# Patient Record
Sex: Male | Born: 1935
Health system: Southern US, Community
[De-identification: ages and names within clinical notes are randomized; demographics above are authoritative.]

## PROBLEM LIST (undated history)

## (undated) DIAGNOSIS — I82409 Acute embolism and thrombosis of unspecified deep veins of unspecified lower extremity: Secondary | ICD-10-CM

## (undated) DIAGNOSIS — I493 Ventricular premature depolarization: Secondary | ICD-10-CM

## (undated) DIAGNOSIS — M1990 Unspecified osteoarthritis, unspecified site: Secondary | ICD-10-CM

## (undated) DIAGNOSIS — Z950 Presence of cardiac pacemaker: Secondary | ICD-10-CM

## (undated) DIAGNOSIS — I428 Other cardiomyopathies: Secondary | ICD-10-CM

## (undated) DIAGNOSIS — I509 Heart failure, unspecified: Secondary | ICD-10-CM

## (undated) DIAGNOSIS — N400 Enlarged prostate without lower urinary tract symptoms: Secondary | ICD-10-CM

## (undated) DIAGNOSIS — E785 Hyperlipidemia, unspecified: Secondary | ICD-10-CM

## (undated) DIAGNOSIS — N189 Chronic kidney disease, unspecified: Secondary | ICD-10-CM

## (undated) DIAGNOSIS — I10 Essential (primary) hypertension: Secondary | ICD-10-CM

## (undated) DIAGNOSIS — I2510 Atherosclerotic heart disease of native coronary artery without angina pectoris: Secondary | ICD-10-CM

## (undated) DIAGNOSIS — I499 Cardiac arrhythmia, unspecified: Secondary | ICD-10-CM

## (undated) DIAGNOSIS — K56609 Unspecified intestinal obstruction, unspecified as to partial versus complete obstruction: Secondary | ICD-10-CM

## (undated) DIAGNOSIS — I480 Paroxysmal atrial fibrillation: Secondary | ICD-10-CM

## (undated) HISTORY — DX: Heart failure, unspecified: I50.9

## (undated) HISTORY — DX: Chronic kidney disease, unspecified: N18.9

## (undated) HISTORY — DX: Other cardiomyopathies: I42.8

## (undated) HISTORY — PX: PARTIAL COLECTOMY: SHX5273

## (undated) HISTORY — DX: Acute embolism and thrombosis of unspecified deep veins of unspecified lower extremity: I82.409

## (undated) HISTORY — PX: TONSILLECTOMY: SUR1361

## (undated) HISTORY — DX: Cardiac arrhythmia, unspecified: I49.9

## (undated) HISTORY — DX: Paroxysmal atrial fibrillation: I48.0

## (undated) HISTORY — DX: Unspecified osteoarthritis, unspecified site: M19.90

## (undated) HISTORY — DX: Benign prostatic hyperplasia without lower urinary tract symptoms: N40.0

## (undated) HISTORY — DX: Ventricular premature depolarization: I49.3

## (undated) HISTORY — PX: VASECTOMY: SHX75

## (undated) HISTORY — PX: OTHER SURGICAL HISTORY: SHX169

## (undated) HISTORY — PX: BASAL CELL CARCINOMA EXCISION: SHX1214

## (undated) HISTORY — PX: APPENDECTOMY: SHX54

## (undated) HISTORY — DX: Hyperlipidemia, unspecified: E78.5

---

## 1998-01-26 ENCOUNTER — Ambulatory Visit (HOSPITAL_BASED_OUTPATIENT_CLINIC_OR_DEPARTMENT_OTHER): Admission: RE | Admit: 1998-01-26 | Discharge: 1998-01-26 | Payer: Self-pay | Admitting: Plastic Surgery

## 1998-02-06 ENCOUNTER — Ambulatory Visit (HOSPITAL_COMMUNITY): Admission: RE | Admit: 1998-02-06 | Discharge: 1998-02-06 | Payer: Self-pay | Admitting: Internal Medicine

## 2000-10-17 ENCOUNTER — Ambulatory Visit (HOSPITAL_COMMUNITY): Admission: RE | Admit: 2000-10-17 | Discharge: 2000-10-17 | Payer: Self-pay | Admitting: *Deleted

## 2003-04-18 ENCOUNTER — Encounter: Payer: Self-pay | Admitting: Emergency Medicine

## 2003-04-18 ENCOUNTER — Inpatient Hospital Stay (HOSPITAL_COMMUNITY): Admission: EM | Admit: 2003-04-18 | Discharge: 2003-04-21 | Payer: Self-pay | Admitting: Emergency Medicine

## 2003-04-21 HISTORY — PX: CARDIAC CATHETERIZATION: SHX172

## 2003-05-01 ENCOUNTER — Ambulatory Visit (HOSPITAL_COMMUNITY): Admission: RE | Admit: 2003-05-01 | Discharge: 2003-05-01 | Payer: Self-pay | Admitting: Internal Medicine

## 2004-12-14 ENCOUNTER — Ambulatory Visit (HOSPITAL_COMMUNITY): Admission: RE | Admit: 2004-12-14 | Discharge: 2004-12-14 | Payer: Self-pay | Admitting: *Deleted

## 2004-12-14 ENCOUNTER — Encounter (INDEPENDENT_AMBULATORY_CARE_PROVIDER_SITE_OTHER): Payer: Self-pay | Admitting: *Deleted

## 2005-10-14 ENCOUNTER — Encounter: Admission: RE | Admit: 2005-10-14 | Discharge: 2005-10-14 | Payer: Self-pay | Admitting: *Deleted

## 2005-10-20 ENCOUNTER — Inpatient Hospital Stay (HOSPITAL_COMMUNITY): Admission: AD | Admit: 2005-10-20 | Discharge: 2005-10-21 | Payer: Self-pay | Admitting: *Deleted

## 2007-07-12 HISTORY — PX: ABDOMINAL SURGERY: SHX537

## 2008-02-19 ENCOUNTER — Emergency Department (HOSPITAL_COMMUNITY): Admission: EM | Admit: 2008-02-19 | Discharge: 2008-02-20 | Payer: Self-pay | Admitting: Emergency Medicine

## 2008-03-08 ENCOUNTER — Inpatient Hospital Stay (HOSPITAL_COMMUNITY): Admission: EM | Admit: 2008-03-08 | Discharge: 2008-03-16 | Payer: Self-pay | Admitting: *Deleted

## 2008-03-11 ENCOUNTER — Encounter (INDEPENDENT_AMBULATORY_CARE_PROVIDER_SITE_OTHER): Payer: Self-pay | Admitting: General Surgery

## 2010-02-12 HISTORY — PX: NM MYOCAR PERF WALL MOTION: HXRAD629

## 2010-02-23 ENCOUNTER — Ambulatory Visit (HOSPITAL_COMMUNITY): Admission: RE | Admit: 2010-02-23 | Discharge: 2010-02-23 | Payer: Self-pay | Admitting: Cardiology

## 2010-02-23 HISTORY — PX: A-V CARDIAC PACEMAKER INSERTION: SHX562

## 2010-09-23 LAB — CBC
MCH: 31 pg (ref 26.0–34.0)
MCHC: 34.3 g/dL (ref 30.0–36.0)
MCV: 90.4 fL (ref 78.0–100.0)
Platelets: 154 10*3/uL (ref 150–400)

## 2010-09-23 LAB — BASIC METABOLIC PANEL
BUN: 19 mg/dL (ref 6–23)
CO2: 24 mEq/L (ref 19–32)
Calcium: 8.7 mg/dL (ref 8.4–10.5)
Chloride: 110 mEq/L (ref 96–112)
Creatinine, Ser: 1.17 mg/dL (ref 0.4–1.5)
Glucose, Bld: 94 mg/dL (ref 70–99)

## 2010-09-23 LAB — SURGICAL PCR SCREEN: Staphylococcus aureus: NEGATIVE

## 2010-09-23 LAB — PROTIME-INR: Prothrombin Time: 18.3 seconds — ABNORMAL HIGH (ref 11.6–15.2)

## 2010-11-23 NOTE — Op Note (Signed)
Samuel Mann, Samuel Mann NO.:  [id number]   MEDICAL RECORD NO.:  [id number]          PATIENT TYPE:  INP   LOCATION:  5151                         FACILITY:  [facility]   PHYSICIAN:  Adolph Pollack, M.D.DATE OF BIRTH:  1922/02/07   DATE OF PROCEDURE:  03/11/2008  DATE OF DISCHARGE:                               OPERATIVE REPORT   PREOPERATIVE DIAGNOSIS:  Cystic right lower quadrant neoplasm.   POSTOPERATIVE DIAGNOSIS:  Cystic right lower quadrant neoplasm.   PROCEDURE:  Laparoscopic converted to open right hemicolectomy with  resection of distal ileum.   SURGEON:  Adolph Pollack, MD   FIRST ASSISTANT:  Kelle Darting. Rennis Harding, NP   SECOND ASSISTANT:  Tomma Rakers, PA student.   ANESTHESIA:  General.   INDICATIONS:  This 77 year old male came in with the abrupt onset of  abdominal pain, nausea, and vomiting.  He had right lower quadrant  tenderness and a CT scan demonstrated a cystic neoplasm of the right  lower quadrant.  There was concern this was a mucocele of the appendix  which could be either benign or malignant.  He has been cleared by  Cardiology, undergone a gentle bowel prep, and is now brought to the  operating room for laparoscopic possible open bowel resection/cystic  removal.   TECHNIQUE:  He was brought to the operating room, placed supine on the  operating table, and general anesthetic was administered.  He was then  placed in the lithotomy position.  The hair on the abdominal wall was  clipped and the abdominal wall was sterilely prepped and draped.  It in  the left upper quadrant, a small incision was made through the skin and  subcutaneous tissue.  Using 5 mm Optiview trocar, the abdominal cavity  was entered and pneumoperitoneum created by insufflation of CO2 gas.  I  looked directly beneath the trocar and noted no injury to viscera or  solid organs.   Following this, a 5-mm trocar was placed in the subxiphoid region and  one in the  subumbilical area.  We visualized the right lower quadrant  and noticed a cystic lesion in the right lower quadrant adherent to the  right lower quadrant peritoneum.  Using sharp dissection, I gently  dissected the mass off the peritoneum quite posterior.  It appeared be  fairly adherent to the terminal ileum and this portion of the ileum  appeared to be somewhat thickened with some creeping fat.  Following  this, I then partially mobilized the right colon by dividing its lateral  attachments in anticipation of having a hemicolectomy.  I then removed  the subumbilical 5-mm trocar, made a slightly larger incision, and  inserted a hand-assist GelPort device.  I then used my hand to complete  further dissection, but noticed that the cystic lesion was fairly close  to the ureter and the gonadal vessels.  Thus, I felt I needed to do this  under more direct vision.  I then extended the periumbilical incision  superiorly and inferiorly.  A self-retaining retractor was used.  Under  direct vision, I carefully dissected the  mass away from the right ureter  and gonadal vessels.  It appeared to be attached to the terminal ileum.  I mobilized the hepatic flexure of the colon.  Because I did not know  the nature of the mass, I decided to perform a right hemicolectomy.   I dissected the omentum free from the proximal transverse colon.  I made  the small incisions in the mesentery close to the bowel and the terminal  ileum and the proximal transverse colon.  I divided the mesentery with  the LigaSure and with suture ligatures.  I further mobilized the lateral  aspect of the right colon allowing the cystic mass as well as the distal  ileum and the right colon to come a bit to the wound.  I then picked  points of resection on the distal ileum and the transverse colon and  made enterotomies in both.  I then performed a side-to-side anastomosis  with a GIA stapler.  The remaining common defect was then  closed with a  linear noncutting stapler which completed the resection, and the  specimen including the distal ileum and right colon were then handed off  the field and sent to pathology.   The anastomosis was inspected and was patent, viable, and under no  tension.  A crotch stitch of 3-0 silk was used.  The mesenteric defect  was closed with interrupted 3-0 silk sutures as well.  Some bleeding  from the common defect closure staple line was controlled with 3-0 silk  sutures.   Following this, gloves were changed.  I then copiously irrigated out the  abdominal cavity with saline solution.  There appeared to be no bleeding  at this time.  No lesions in the liver.  Gallbladder was without  gallstones.  Stomach appeared to be within normal limits.  No pelvic or  distal colon lesions were noted.   I then evacuated the irrigation fluid.  I began reapproximating the  fascia with a running #1 PDS suture.  Needle, sponge, and instrument  counts were reported to be correct.  The trocars were removed and the  subcutaneous tissue of the midline incision was irrigated and the skin  of the midline incision and the trocar sites were closed with staples.  Sterile dressings were applied.   He tolerated the procedure well without any apparent complications and  was taken to the recovery in satisfactory condition.      Adolph Pollack, M.D.  Electronically Signed     TJR/MEDQ  D:  03/11/2008  T:  03/12/2008  Job:  [id number]

## 2010-11-23 NOTE — H&P (Signed)
Samuel Mann, GUBBELS NO.:  [id number]   MEDICAL RECORD NO.:  [id number]          PATIENT TYPE:  INP   LOCATION:  5151                         FACILITY:  [facility]   PHYSICIAN:  Juanetta Gosling, MDDATE OF BIRTH:  Nov 27, 1921   DATE OF ADMISSION:  03/08/2008  DATE OF DISCHARGE:                              HISTORY & PHYSICAL   CONSULTANT:  Samuel Jester, DO   CHIEF COMPLAINT:  Abdominal pain.   HISTORY OF PRESENT ILLNESS:  An 77 year old male who ate lunch yesterday  at 1:30 and had developed generalized abdominal pain associated with  some nausea and no vomiting.  He had a normal bowel movement last night.  He was awakened from sleep last night at about 2 a.m., called the EMS,  and they brought him to the emergency room.  Since then, he describes  resolution of most of his pain and he is hungry now.  He has a history  of a normal colonoscopy 3-4 years ago.   PAST MEDICAL HISTORY:  Hypertension and atrial fibrillation.   PAST SURGICAL HISTORY:  Pacemaker for bradycardia.   SOCIAL HISTORY:  Nonsmoker.  No alcohol.   ALLERGIES:  No known drug allergies.   MEDICATIONS:  1. Coumadin.  2. Diovan 80 mg daily.  3. Crestor.  4. Lopressor 50 b.i.d.   REVIEW OF SYSTEMS:  He has some occasional shortness of breath with  exertion.   PHYSICAL EXAMINATION:  VITAL SIGNS:  Afebrile with stable vital signs.  HEENT:  Normocephalic and atraumatic.  NECK:  Supple without adenopathy.  CHEST:  Clear bilaterally.  He has a pacemaker in place.  HEART:  Irregularly irregular.  ABDOMEN:  Soft.  Mild tenderness to right lower quadrant with no  peritonitis.  GU:  No hernia.  EXTREMITIES:  Nontender.  NEUROLOGIC:  He is grossly normal.   His CT scan of his abdomen shows a couple of cystic lesions in his  kidneys as well as some free fluid in his right lower quadrant and his  pelvis.  He has tubular, well-defined fluid collection in his paracecal  region that may  represent an appendiceal mucocele, he has no other  findings of appendicitis on his CT scan.   LABORATORY DATA:  White blood cell count of 12.2, hematocrit of 38.4,  platelets of 195.  His INR is 2.6.  Lipase is 20.  LFTs are normal.  His  BUN and creatinine are 28.2 and 1.6.  His UA shows no evidence of  infection.   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   PLAN:  I do not think he has appendicitis either by exam or his CT scan.  I will plan on admitting him as he does have a mass in his right lower  quadrant.  We will give him vitamin K for his INR.  Let his INR drift  back to normal and we will likely need an exploration with what appeared  to be a right hemicolectomy, however, versus possible  appendectomy/ileocectomy once his INR is normal.  will consider  colonoscopy prior to surgery as well.  Juanetta Gosling, MD  Electronically Signed     MCW/MEDQ  D:  03/08/2008  T:  03/09/2008  Job:  [id number]

## 2010-11-26 NOTE — Cardiovascular Report (Signed)
Samuel Mann, Samuel Mann NO.:  [id number]   MEDICAL RECORD NO.:  [id number]                   PATIENT TYPE:  INP   LOCATION:  2019                                 FACILITY:  [facility]   PHYSICIAN:  Cristy Hilts. Jacinto Halim, M.D.                  DATE OF BIRTH:  1921/12/16   DATE OF PROCEDURE:  04/21/2003  DATE OF DISCHARGE:                              CARDIAC CATHETERIZATION   ATTENDING CARDIOLOGIST:  Nanetta Batty, M.D.   REFERRING PHYSICIAN:  Soyla Murphy. Renne Crigler, M.D.   PROCEDURE PERFORMED:  1. Left ventriculography.  2. Selective right and left coronary arteriography.  3. Abdominal aortogram.  4. Right femoral angiography and closure of the right femoral artery access     with Perclose.   INDICATIONS:  Mr. Samuel Mann is a 77 year old active gentleman who has been  having increasing shortness of breath and chest discomfort over the last two  to three months.  Was initially seen in the outpatient setting and was set  up for an outpatient Cardiolite stress test.  However, he presented to the  emergency room with chest pain which was not relieved with two sublingual  nitroglycerin via EMS.  Myocardial infarction was ruled out and because of  his chest pain he was brought directly to the catheterization laboratory to  evaluate his coronary anatomy.   HEMODYNAMIC DATA:  Left ventricular pressures were 102/5, with end-diastolic  pressure of 11 mmHg.  The aortic pressures were 94/48 with a mean of 67  mmHg.  There was no significant pressure gradient across the aortic valve.   ANGIOGRAPHIC DATA:  Left ventricle:  Left ventricular systolic function was  on the lower limit of normal and was estimated at 55%.  There is mild  anterolateral wall hypokinesis.   CORONARY ANGIOGRAPHY:  1. Right coronary artery:  RCA is nondominant.  2. Left main coronary artery:  LMCA is normal.  3. Circumflex coronary artery:  Circumflex coronary artery is a large     caliber vessel and  a dominant vessel.  It is smooth and gives origin to a     large PDA.  It gives origin to three small obtuse marginals and a     moderate sized obtuse marginal #4.  Continues as PDA.  4. Left anterior descending artery:  Left anterior descending is, again, a     large caliber vessel.  It wraps around the apex.  Gives origin to two     small diagonals and a moderate sized diagonal 3.  Proximal LAD has mild     luminal irregularity.  Mid to distal LAD has a small intramyocardial     bridging.   ABDOMINAL AORTOGRAM:  Abdomen aortogram revealed presence of two renal  arteries, one on either side.  They were normal.  The aortoiliac bifurcation  was normal.   IMPRESSION:  1. Noncritical coronary artery disease.  2. Mild anterolateral wall hypokinesis the etiology for which remains     uncertain and I could not see any hazy or thrombus containing lesion in     the left anterior descending.  However, there was slow filling noted in     all the coronary arteries.  This could be secondary to microvascular     disease.   RECOMMENDATIONS:  Proton pump inhibitors.  Will stop his beta blockers given  his significant bradycardia.  Will continue lipid lowering therapy and  aspirin.  Continue ACE inhibitors for hypertension.  Plus/minus  nitroglycerin for microvascular disease.  The patient will follow up with  Nanetta Batty, M.D.   TECHNIQUE OF PROCEDURE:  Under the usual sterile precautions using a 6-  French right femoral arterial access sheath, 6-French multipurpose B2  catheter was advanced to the ascending aorta with 0.035 [place] J-wire.  The  catheter was gently advanced to the left ventricle.  Left ventricular  pressures were monitored.  Hand contrast into the left ventricle was  performed both in LAO and RAO projection.  The catheter was flushed with  saline and pulled back into the ascending aorta and pressure gradient across  the aorta was monitored.  Right coronary artery was  selectively engaged.  Angiography was performed.  Then the catheter was pulled back into the  abdominal aorta and abdominal aortogram was performed.  Then the catheter  was pulled out and initially a 6-French Judkins left 5 and then a Judkins  left 4 diagnostic catheter was utilized to engage the left main coronary  artery and angiography was performed.  Then the catheter was pulled out of  the body in the usual fashion.  Right femoral angiography was performed  through the arterial access sheath and the access was closed with Perclose  with excellent hemostasis obtained.  The patient was transferred to the  recovery in a stable condition.                                               Cristy Hilts. Jacinto Halim, M.D.    Pilar Plate  D:  04/21/2003  T:  04/21/2003  Job:  [id number]   cc:   Nanetta Batty, M.D.  [address]  [place]  [place] [address]  Fax: [phone]   Soyla Murphy. Renne Crigler, M.D.  [address]  [place]  [place] [address]  Fax: [phone]

## 2010-11-26 NOTE — Discharge Summary (Signed)
NAMECARLA, WHILDEN                 ACCOUNT NO.:  [id number]   MEDICAL RECORD NO.:  [id number]          PATIENT TYPE:  INP   LOCATION:  5151                         FACILITY:  [facility]   PHYSICIAN:  Clovis Pu. Cornett, M.D.DATE OF BIRTH:  02/14/1922   DATE OF ADMISSION:  03/08/2008  DATE OF DISCHARGE:  03/16/2008                               DISCHARGE SUMMARY   ADMITTING DIAGNOSES:  1. Abdominal pain.  2. Cystic mass of right colon.  3. Atrial fibrillation.  4. History of pacemaker.   DISCHARGE DIAGNOSES:  1. Abdominal pain.  2. Cystic mass of right colon.  3. Atrial fibrillation.  4. History of pacemaker.   PROCEDURE PERFORMED:  Right hemicolectomy.   BRIEF HISTORY:  The patient is an 77 year old male admitted on March 08, 2008, due to abdominal pain.  CT scan showed a cystic mass of his  right colon and he was admitted for further evaluation.  He was on  anticoagulation due to chronic atrial fibrillation.  He was seen by the  Surgical Service, evaluated and surgery was recommended for resection of  his right colon due to the cystic mass.   HOSPITAL COURSE:  The patient underwent right hemicolectomy by Dr. Avel Peace on March 11, 2008.  Please see operative note for details.  The patient's operative course was relatively unremarkable.  He had a  somewhat of an ileus and this resolved over the next 2-3 days.  His  hemoglobin remained relatively stable.  His bowel function returned.  His Coumadin was restarted prior to discharge.  His wounds were clean,  dry, and intact.  He was afebrile and he was discharged home on  March 16, 2008, in satisfactory condition.  Final pathology showed  benign mesothelial cyst involving terminal ileum with associated  fibrosis and adhesions.   DISCHARGE INSTRUCTIONS:  He will go home and a soft diet to be advanced  and regular diet as tolerated.  He will refrain from lifting and driving  for 2-4 weeks until he sees Dr. Abbey Chatters  back in the office.  He will  be placed back on his preoperative medications, which include a Diovan  80 mg daily, Lopressor 50 mg p.o. b.i.d. and Coumadin at discharge, he  was on 5 mg daily.  He will have his PT/INR checked in 2 days.   CONDITION ON DISCHARGE:  Improved.     Thomas A. Cornett, M.D.  Electronically Signed    TAC/MEDQ  D:  05/01/2008  T:  05/01/2008  Job:  [id number]

## 2010-11-26 NOTE — Op Note (Signed)
NAMEJAHRON, HUNSINGER NO.:  [id number]   MEDICAL RECORD NO.:  [id number]          PATIENT TYPE:  OIB   LOCATION:  2807                         FACILITY:  [facility]   PHYSICIAN:  Darlin Priestly, MD  DATE OF BIRTH:  1921/10/28   DATE OF PROCEDURE:  10/20/2005  DATE OF DISCHARGE:                                 OPERATIVE REPORT   PROCEDURE PERFORMED:  Insertion of a Medtronic EnRhythm P1501 DR generator,  serial number [id number] with passive atrial and ventricular leads.   ATTENDING PHYSICIAN:  Darlin Priestly, MD   COMPLICATIONS:  None.   INDICATIONS FOR PROCEDURE:  Mr. Langdon is an 77 year old male patient of Dr.  Nanetta Batty, Dr. Merri Brunette with a history of noncritical coronary  artery disease by cath in 2004 with normal left ventricular function.  He  has had chronic bradycardia with six to 12 months of increasing lethargy and  recent Holter monitor revealing evidence of tachybrady syndrome with rates  in the 40s.  He has also had some mild dizziness upon standing.  He is now  referred for a dual chamber pacer implant.   DESCRIPTION OF PROCEDURE:  After obtaining informed consent, the patient was  brought to the cardiac catheterization lab where the left chest was shaved,  prepped and draped in the usual sterile fashion.  Anesthesia monitoring  established.  1% lidocaine was then used to anesthetize the left subclavian  region.  Next, an approximately 3 cm mid infraclavicular incision was then  carried out beneath the left clavicle and hemostasis was obtained with  electrocautery.  Blunt dissection was used to carry this down to the left  pectoral fascia.  Next, approximately a 3 x 4 cm pocket was then created  over the left pectoral fascia and again hemostasis was obtained with  electrocautery.  The left subclavian vein was then easily visualized with IV  contrast and two left subclavian sticks were then used to introduce two  retained guide wires  which were easily positioned in the SVC and right  atrium.  A #7 French safe sheath was then placed over the first guidewire  and the guidewire and dilator were removed.  Following this, a 58 cm passive  Medtronic lead, model number [id number], serial number [id number] was then easily  passed into the right atrium and peel-away sheath was then removed.  A  second 7 French safe sheath was then inserted over the second retained  guidewire.  The guidewire and dilator were then removed.  Next, a 53 cm  passive Medtronic lead, model number 5594, serial number [id number] was then  passed into the right atrium and peel-away sheath removed.  A J-curve was  then placed on the ventricular lead stylet.  The ventricular lead was then  allowed to prolapse to the tricuspid valve in position of the RV apex  without difficulty.  Thresholds were then determined.  R-waves were measured  at 16.5 mV.  Thresholds were 0.4 V at 0.5 msec.  Impedance is 709 ohms.  Current is 0.7 mA.  10 V negative for diaphragmatic stimulation.  This lead  was then secured to the pectoral fascia with two silk sutures.  The atrial  lead was then positioned in the right atrial appendage and thresholds were  determined.  P-waves were measured at 5.5 mV.  Threshold in the atrium was  0.8 volts at 0.5 msec.  Impedance 448 ohms.  Current 2.1 mA.  Again 10 V  negative for diaphragmatic stimulation.  Two silk sutures were then used to  secure this lead to the pectoral fascia.  The pocket was then copiously  irrigated with 1% Kanamycin solution and again hemostasis was confirmed.  The leads were then connected in serial fashion to the Medtronic EnRhythm  generator, model P1501Dr, serial number [id number].  Head screws were  tightened and the pacing was confirmed.  A single silk suture was then  placed in the apex of the pocket.  The generator and leads were then  delivered to the pocket and the header was secured to the silk suture.  The   subcutaneous layer was then closed with running 2-0 Vicryl.  The skin was  then closed with running 4-0 Vicryl.  Steri-Strips were then applied.  The  patient was then transferred to the recovery room in stable condition.   CONCLUSION:  Successful implant of a Medtronic EnRhythm [id number] generator,  serial number [id number] with passive atrial and ventricular leads.      Darlin Priestly, MD  Electronically Signed     RHM/MEDQ  D:  10/20/2005  T:  10/20/2005  Job:  [id number]   cc:   Nanetta Batty, M.D.  Fax: [phone]   Soyla Murphy. Renne Crigler, M.D.  Fax: [phone]

## 2010-11-26 NOTE — Op Note (Signed)
NAMEMAYFIELD, SCHOENE NO.:  [id number]   MEDICAL RECORD NO.:  [id number]          PATIENT TYPE:  AMB   LOCATION:  ENDO                         FACILITY:  [facility]   PHYSICIAN:  Georgiana Spinner, M.D.    DATE OF BIRTH:  [birth year]   DATE OF PROCEDURE:  12/14/2004  DATE OF DISCHARGE:                                 OPERATIVE REPORT   PROCEDURE:  Upper endoscopy.   INDICATIONS:  Abdominal pain.   ANESTHESIA:  Demerol 30, Versed 3 milligrams.   PROCEDURE:  With the patient mildly sedated in the left lateral decubitus  position the Olympus videoscopic endoscope was inserted in the mouth and  passed under direct vision through the esophagus which appeared normal into  the stomach. The fundus body, antrum, duodenal bulb, second portion duodenum  were visualized.  From this point, the endoscope was slowly withdrawn taking  circumferential views of the duodenal mucosa stopping in the bulb where some  inflammatory changes were noted. They was fairly mild but they were  photographed and biopsied. We pulled the endoscope back into the stomach,  placed it in retroflexion to view the stomach from below.  The endoscope was  then straightened and withdrawn taking circumferential views remaining  gastric and esophageal mucosa stopping to biopsy fundic polyp.  The  endoscope was then withdrawn. The patient's vital signs, pulse oximeter  remained stable. The patient tolerated procedure well without apparent  complications.   FINDINGS:  Mild inflammatory changes in the duodenal bulb, gastric fundus  polyp. Await biopsy reports. The patient will call me for results. Follow-up  with me as an outpatient.      GMO/MEDQ  D:  12/14/2004  T:  12/14/2004  Job:  [id number]

## 2010-11-26 NOTE — Procedures (Signed)
[place]  Patient:    Samuel Mann, Samuel Mann                          MRN: [id number] Adm. Date:  [id number] Attending:  Sabino Gasser                           Procedure Report  PROCEDURE:  Colonoscopy.  INDICATIONS:  Rectal bleeding.  ANESTHESIA:  Demerol 40 mg, Versed 4 mg.  DESCRIPTION OF PROCEDURE:  With patient mildly sedated in the left lateral decubitus position, a rectal exam was performed, which was unremarkable. Subsequently the Olympus videoscopic colonoscope was inserted in the rectum and passed under direct vision to the cecum, identified by the ileocecal valve and appendiceal orifice, both of which were photographed.  From this point, the colonoscope was slowly withdrawn, taking circumferential views of the entire colonic mucosa, stopping in the rectum, which appeared normal on direct and retroflex view.  The endoscope was straightened and withdrawn.  The patients vital signs and pulse oximetry remained stable.  The patient tolerated the procedure well with no apparent complications.  FINDINGS:  Unremarkable colonoscopic examination to the cecum.  PLAN:  Repeat examination in five to 10 years. DD:  10/17/00 TD:  10/17/00 Job: [id number]

## 2010-11-26 NOTE — Discharge Summary (Signed)
Samuel Mann, DEAVERS NO.:  [id number]   MEDICAL RECORD NO.:  [id number]          PATIENT TYPE:  INP   LOCATION:  4707                         FACILITY:  [facility]   PHYSICIAN:  Darlin Priestly, MD  DATE OF BIRTH:  April 26, 1922   DATE OF ADMISSION:  10/20/2005  DATE OF DISCHARGE:  10/21/2005                                 DISCHARGE SUMMARY   DISCHARGE DIAGNOSES:  1.  Medtronic pacemaker implant October 20, 2005 for sick sinus syndrome with      sinus tachycardia and marked sinus bradycardia on outpatient Holter.  2.  Noncritical coronary disease by catheterization 2004 with normal LV      function.  3.  Dyslipidemia.  4.  Treated hypertension.   HOSPITAL COURSE:  The patient is an 77 year old male followed by Dr. Allyson Sabal  and Dr. Merri Brunette.  He has chronic bradycardia and has at times had  tachycardia.  He is followed by Dr. Renne Crigler and Dr. Allyson Sabal.  He had noncritical  coronary disease by catheterization in 2004.  A 24-hour Holter as an  outpatient ordered through [facility] revealed evidence of sinus  rhythm, sinus arrhythmia, intermittent episodes of sinus tachycardia and  occasional marked sinus bradycardia.  He had heart rates at home that he has  recorded of less than 40 when he was active.  He has had no syncope or  presyncope, although he does have some dizziness at times, going from  sitting to standing.  He was seen by Dr. Jenne Campus September 29, 2005 and was  admitted for elective pacemaker implant on October 20, 2005.  The patient had  a Medtronic dual chamber pacemaker implanted without complications.  He was  discharged October 21, 2005.   DISCHARGE MEDICATIONS:  1.  Lipid 10 mg a day.  2.  Micardis 20 mg a day.   LABORATORY DATA:  EKG shows a sinus rhythm, PACs. Chest x-ray post procedure  shows no pneumothorax.   DISPOSITION:  The patient discharged in stable condition and will follow-up  with Dr. Jenne Campus as an outpatient.      Abelino Derrick,  P.A.      Darlin Priestly, MD  Electronically Signed   LKK/MEDQ  D:  11/25/2005  T:  11/25/2005  Job:  [id number]   cc:   Soyla Murphy. Renne Crigler, M.D.  Fax: [phone]

## 2010-11-26 NOTE — Discharge Summary (Signed)
Samuel Mann, Samuel Mann NO.:  [id number]   MEDICAL RECORD NO.:  [id number]                   PATIENT TYPE:  INP   LOCATION:  2019                                 FACILITY:  [facility]   PHYSICIAN:  Cristy Hilts. Jacinto Halim, M.D.                  DATE OF BIRTH:  11-Jan-1922   DATE OF ADMISSION:  04/18/2003  DATE OF DISCHARGE:  04/21/2003                                 DISCHARGE SUMMARY   DISCHARGE DIAGNOSES:  1. Unstable angina.  2. Noncritical coronary disease at catheterization.  3. Treated hypertension.  4. Premature ventricular contractions.   HOSPITAL COURSE:  The patient is a 77 year old male followed by Dr. Allyson Sabal  and Dr. Jamie Kato.  He had seen Dr. Allyson Sabal recently and was to have a stress test.  He has had a previous negative Cardiolite in 2000.  He presented to the  emergency room April 18, 2003 after having some chest discomfort while  working in his wood shop.  Symptoms were worrisome for unstable angina.  He  was admitted to telemetry, started on heparin and nitroglycerin.  He was set  up for diagnostic catheterization.  He ruled out for an myocardial  infarction.  Catheterization done April 21, 2003 revealed normal LV  function, normal nondominant RCA, normal left main, 20% proximal narrowing  in the LAD, normal circumflex.  He had normal renal arteries and normal  iliacs.  Dr. Jacinto Halim feels he has noncritical coronary disease.  We did make  some adjustments in his medications.  He had been on a low dose of beta  blocker while in the hospital and had a run of isoarrhythmic AV dissociation  and accelerated junctional rhythm and PVCs, beta blocker was discontinued.  We will continue him on a statin and baby aspirin.  We changed his  antihypertensive to Altace.  He will follow up with Dr. Jamie Kato.  He has been  instructed not to do any strenuous activity for the next 48 hours.   DISCHARGE MEDICATIONS:  1. Coated aspirin 81 mg daily.  2. Altace 2.5 mg daily.  3.  Lipitor 10 mg h.s.   LABORATORY DATA:  White count 9.0, hemoglobin 11.9, hematocrit 34.7,  platelets 204, INR 1.1, sodium 142, potassium 4.0, BUN 22, creatinine 1.3.  Liver functions are normal.  CK-MB and troponins were negative.  Lipid  profile shows a cholesterol of 180, triglycerides 69, HDL 57, LDL 109, TSH  3.02.  Urinalysis is unremarkable.  Chest x-ray shows borderline mild  cardiomegaly and some atelectasis.  EKG shows sinus rhythm with PVCs.   DISPOSITION:  The patient discharged in stable condition and will follow up  with Dr. Jamie Kato.       Abelino Derrick, P.A.                      Cristy Hilts. Jacinto Halim, M.D.  LKK/MEDQ  D:  04/21/2003  T:  04/22/2003  Job:  [id number]

## 2011-03-01 HISTORY — PX: US ECHOCARDIOGRAPHY: HXRAD669

## 2011-04-08 LAB — DIFFERENTIAL
Basophils Absolute: 0
Basophils Relative: 0
Eosinophils Relative: 2
Lymphocytes Relative: 23
Monocytes Absolute: 0.8
Monocytes Relative: 11
Neutro Abs: 4.6

## 2011-04-08 LAB — POCT I-STAT, CHEM 8
BUN: 23
Calcium, Ion: 1.2
Chloride: 109
Creatinine, Ser: 1.1
Glucose, Bld: 97
HCT: 38 — ABNORMAL LOW
Hemoglobin: 12.9 — ABNORMAL LOW
Potassium: 4.2
Sodium: 143
TCO2: 24

## 2011-04-08 LAB — CBC
HCT: 36.7 — ABNORMAL LOW
Hemoglobin: 12.6 — ABNORMAL LOW
MCHC: 34.5
RBC: 4.06 — ABNORMAL LOW
RDW: 13.2

## 2011-04-08 LAB — POCT CARDIAC MARKERS
CKMB, poc: 1.3
Myoglobin, poc: 73.1
Troponin i, poc: 0.05

## 2011-04-13 LAB — BASIC METABOLIC PANEL
CO2: 26
CO2: 26
Calcium: 8.1 — ABNORMAL LOW
Chloride: 105
Creatinine, Ser: 1.35
GFR calc Af Amer: >60
GFR calc Af Amer: >60
Potassium: 4.2
Potassium: 4.8
Sodium: 139
Sodium: 141

## 2011-04-13 LAB — CBC
HCT: 31.6 — ABNORMAL LOW
HCT: 33.1 — ABNORMAL LOW
Hemoglobin: 10.5 — ABNORMAL LOW
Hemoglobin: 11.3 — ABNORMAL LOW
MCHC: 33.4
MCV: 91.4
RBC: 3.37 — ABNORMAL LOW
RBC: 3.62 — ABNORMAL LOW
WBC: 10.5

## 2011-04-13 LAB — PREPARE FRESH FROZEN PLASMA

## 2011-04-13 LAB — TYPE AND SCREEN
ABO/RH(D): O NEG
Antibody Screen: NEGATIVE

## 2011-04-13 LAB — APTT
aPTT: 38 — ABNORMAL HIGH
aPTT: 40 — ABNORMAL HIGH

## 2011-04-13 LAB — PROTIME-INR
INR: 1.2
INR: 1.3
INR: 1.3
Prothrombin Time: 16.6 — ABNORMAL HIGH

## 2011-06-17 ENCOUNTER — Other Ambulatory Visit: Payer: Self-pay | Admitting: Dermatology

## 2011-07-18 DIAGNOSIS — H40019 Open angle with borderline findings, low risk, unspecified eye: Secondary | ICD-10-CM | POA: Diagnosis not present

## 2011-07-28 DIAGNOSIS — I4891 Unspecified atrial fibrillation: Secondary | ICD-10-CM | POA: Diagnosis not present

## 2011-07-28 DIAGNOSIS — Z45018 Encounter for adjustment and management of other part of cardiac pacemaker: Secondary | ICD-10-CM | POA: Diagnosis not present

## 2011-07-28 DIAGNOSIS — I509 Heart failure, unspecified: Secondary | ICD-10-CM | POA: Diagnosis not present

## 2011-08-04 DIAGNOSIS — Z7901 Long term (current) use of anticoagulants: Secondary | ICD-10-CM | POA: Diagnosis not present

## 2011-08-04 DIAGNOSIS — I4891 Unspecified atrial fibrillation: Secondary | ICD-10-CM | POA: Diagnosis not present

## 2011-09-05 DIAGNOSIS — I4891 Unspecified atrial fibrillation: Secondary | ICD-10-CM | POA: Diagnosis not present

## 2011-09-05 DIAGNOSIS — Z7901 Long term (current) use of anticoagulants: Secondary | ICD-10-CM | POA: Diagnosis not present

## 2011-10-06 DIAGNOSIS — Z7901 Long term (current) use of anticoagulants: Secondary | ICD-10-CM | POA: Diagnosis not present

## 2011-10-06 DIAGNOSIS — I4891 Unspecified atrial fibrillation: Secondary | ICD-10-CM | POA: Diagnosis not present

## 2011-10-22 DIAGNOSIS — I4891 Unspecified atrial fibrillation: Secondary | ICD-10-CM | POA: Diagnosis not present

## 2011-10-22 DIAGNOSIS — I509 Heart failure, unspecified: Secondary | ICD-10-CM | POA: Diagnosis not present

## 2011-10-26 DIAGNOSIS — I10 Essential (primary) hypertension: Secondary | ICD-10-CM | POA: Diagnosis not present

## 2011-10-26 DIAGNOSIS — E7800 Pure hypercholesterolemia, unspecified: Secondary | ICD-10-CM | POA: Diagnosis not present

## 2011-10-26 DIAGNOSIS — Z79899 Other long term (current) drug therapy: Secondary | ICD-10-CM | POA: Diagnosis not present

## 2011-10-31 DIAGNOSIS — E7800 Pure hypercholesterolemia, unspecified: Secondary | ICD-10-CM | POA: Diagnosis not present

## 2011-10-31 DIAGNOSIS — D649 Anemia, unspecified: Secondary | ICD-10-CM | POA: Diagnosis not present

## 2011-10-31 DIAGNOSIS — Z0000 Encounter for general adult medical examination without abnormal findings: Secondary | ICD-10-CM | POA: Diagnosis not present

## 2011-10-31 DIAGNOSIS — I10 Essential (primary) hypertension: Secondary | ICD-10-CM | POA: Diagnosis not present

## 2011-10-31 DIAGNOSIS — M1710 Unilateral primary osteoarthritis, unspecified knee: Secondary | ICD-10-CM | POA: Diagnosis not present

## 2011-10-31 DIAGNOSIS — N529 Male erectile dysfunction, unspecified: Secondary | ICD-10-CM | POA: Diagnosis not present

## 2011-11-07 DIAGNOSIS — I4891 Unspecified atrial fibrillation: Secondary | ICD-10-CM | POA: Diagnosis not present

## 2011-11-07 DIAGNOSIS — Z7901 Long term (current) use of anticoagulants: Secondary | ICD-10-CM | POA: Diagnosis not present

## 2011-11-28 DIAGNOSIS — I10 Essential (primary) hypertension: Secondary | ICD-10-CM | POA: Diagnosis not present

## 2011-12-06 DIAGNOSIS — Z7901 Long term (current) use of anticoagulants: Secondary | ICD-10-CM | POA: Diagnosis not present

## 2011-12-06 DIAGNOSIS — I4891 Unspecified atrial fibrillation: Secondary | ICD-10-CM | POA: Diagnosis not present

## 2011-12-15 DIAGNOSIS — R5381 Other malaise: Secondary | ICD-10-CM | POA: Diagnosis not present

## 2011-12-15 DIAGNOSIS — I10 Essential (primary) hypertension: Secondary | ICD-10-CM | POA: Diagnosis not present

## 2011-12-27 DIAGNOSIS — Z7901 Long term (current) use of anticoagulants: Secondary | ICD-10-CM | POA: Diagnosis not present

## 2011-12-27 DIAGNOSIS — I4891 Unspecified atrial fibrillation: Secondary | ICD-10-CM | POA: Diagnosis not present

## 2012-01-11 DIAGNOSIS — H526 Other disorders of refraction: Secondary | ICD-10-CM | POA: Diagnosis not present

## 2012-01-11 DIAGNOSIS — H40009 Preglaucoma, unspecified, unspecified eye: Secondary | ICD-10-CM | POA: Diagnosis not present

## 2012-01-11 DIAGNOSIS — H35379 Puckering of macula, unspecified eye: Secondary | ICD-10-CM | POA: Diagnosis not present

## 2012-01-11 DIAGNOSIS — H01009 Unspecified blepharitis unspecified eye, unspecified eyelid: Secondary | ICD-10-CM | POA: Diagnosis not present

## 2012-01-11 DIAGNOSIS — D3130 Benign neoplasm of unspecified choroid: Secondary | ICD-10-CM | POA: Diagnosis not present

## 2012-01-20 DIAGNOSIS — R0989 Other specified symptoms and signs involving the circulatory and respiratory systems: Secondary | ICD-10-CM | POA: Diagnosis not present

## 2012-01-20 DIAGNOSIS — R0609 Other forms of dyspnea: Secondary | ICD-10-CM | POA: Diagnosis not present

## 2012-01-20 DIAGNOSIS — I10 Essential (primary) hypertension: Secondary | ICD-10-CM | POA: Diagnosis not present

## 2012-01-23 DIAGNOSIS — Z7901 Long term (current) use of anticoagulants: Secondary | ICD-10-CM | POA: Diagnosis not present

## 2012-01-23 DIAGNOSIS — I4891 Unspecified atrial fibrillation: Secondary | ICD-10-CM | POA: Diagnosis not present

## 2012-02-01 DIAGNOSIS — I472 Ventricular tachycardia: Secondary | ICD-10-CM | POA: Diagnosis not present

## 2012-02-01 DIAGNOSIS — Z45018 Encounter for adjustment and management of other part of cardiac pacemaker: Secondary | ICD-10-CM | POA: Diagnosis not present

## 2012-02-15 DIAGNOSIS — I10 Essential (primary) hypertension: Secondary | ICD-10-CM | POA: Diagnosis not present

## 2012-02-15 DIAGNOSIS — R5383 Other fatigue: Secondary | ICD-10-CM | POA: Diagnosis not present

## 2012-02-20 DIAGNOSIS — Z7901 Long term (current) use of anticoagulants: Secondary | ICD-10-CM | POA: Diagnosis not present

## 2012-02-20 DIAGNOSIS — I4891 Unspecified atrial fibrillation: Secondary | ICD-10-CM | POA: Diagnosis not present

## 2012-02-22 DIAGNOSIS — H40019 Open angle with borderline findings, low risk, unspecified eye: Secondary | ICD-10-CM | POA: Diagnosis not present

## 2012-02-22 DIAGNOSIS — H526 Other disorders of refraction: Secondary | ICD-10-CM | POA: Diagnosis not present

## 2012-03-20 DIAGNOSIS — I4891 Unspecified atrial fibrillation: Secondary | ICD-10-CM | POA: Diagnosis not present

## 2012-03-20 DIAGNOSIS — Z7901 Long term (current) use of anticoagulants: Secondary | ICD-10-CM | POA: Diagnosis not present

## 2012-04-17 DIAGNOSIS — Z7901 Long term (current) use of anticoagulants: Secondary | ICD-10-CM | POA: Diagnosis not present

## 2012-04-17 DIAGNOSIS — I4891 Unspecified atrial fibrillation: Secondary | ICD-10-CM | POA: Diagnosis not present

## 2012-04-18 DIAGNOSIS — Z23 Encounter for immunization: Secondary | ICD-10-CM | POA: Diagnosis not present

## 2012-04-18 DIAGNOSIS — I10 Essential (primary) hypertension: Secondary | ICD-10-CM | POA: Diagnosis not present

## 2012-04-28 DIAGNOSIS — I4891 Unspecified atrial fibrillation: Secondary | ICD-10-CM | POA: Diagnosis not present

## 2012-04-28 DIAGNOSIS — I472 Ventricular tachycardia: Secondary | ICD-10-CM | POA: Diagnosis not present

## 2012-05-08 ENCOUNTER — Emergency Department (HOSPITAL_COMMUNITY): Payer: Medicare Other

## 2012-05-08 ENCOUNTER — Encounter (HOSPITAL_COMMUNITY): Payer: Self-pay | Admitting: Emergency Medicine

## 2012-05-08 ENCOUNTER — Emergency Department (HOSPITAL_COMMUNITY)
Admission: EM | Admit: 2012-05-08 | Discharge: 2012-05-08 | Disposition: A | Discharge status: Home / Self Care | Payer: Medicare Other | Attending: Emergency Medicine | Admitting: Emergency Medicine

## 2012-05-08 DIAGNOSIS — Z950 Presence of cardiac pacemaker: Secondary | ICD-10-CM | POA: Diagnosis not present

## 2012-05-08 DIAGNOSIS — R197 Diarrhea, unspecified: Secondary | ICD-10-CM | POA: Diagnosis not present

## 2012-05-08 DIAGNOSIS — I10 Essential (primary) hypertension: Secondary | ICD-10-CM | POA: Insufficient documentation

## 2012-05-08 DIAGNOSIS — Z79899 Other long term (current) drug therapy: Secondary | ICD-10-CM | POA: Diagnosis not present

## 2012-05-08 DIAGNOSIS — K2990 Gastroduodenitis, unspecified, without bleeding: Secondary | ICD-10-CM | POA: Diagnosis not present

## 2012-05-08 DIAGNOSIS — Z7901 Long term (current) use of anticoagulants: Secondary | ICD-10-CM | POA: Insufficient documentation

## 2012-05-08 DIAGNOSIS — R1033 Periumbilical pain: Secondary | ICD-10-CM | POA: Diagnosis not present

## 2012-05-08 DIAGNOSIS — I2510 Atherosclerotic heart disease of native coronary artery without angina pectoris: Secondary | ICD-10-CM | POA: Insufficient documentation

## 2012-05-08 DIAGNOSIS — R109 Unspecified abdominal pain: Secondary | ICD-10-CM | POA: Diagnosis not present

## 2012-05-08 DIAGNOSIS — Z9889 Other specified postprocedural states: Secondary | ICD-10-CM | POA: Diagnosis not present

## 2012-05-08 DIAGNOSIS — K5900 Constipation, unspecified: Secondary | ICD-10-CM | POA: Diagnosis not present

## 2012-05-08 DIAGNOSIS — R110 Nausea: Secondary | ICD-10-CM | POA: Diagnosis not present

## 2012-05-08 HISTORY — DX: Essential (primary) hypertension: I10

## 2012-05-08 HISTORY — DX: Atherosclerotic heart disease of native coronary artery without angina pectoris: I25.10

## 2012-05-08 LAB — COMPREHENSIVE METABOLIC PANEL
AST: 23 U/L (ref 0–37)
BUN: 28 mg/dL — ABNORMAL HIGH (ref 6–23)
CO2: 23 mEq/L (ref 19–32)
Calcium: 9.2 mg/dL (ref 8.4–10.5)
Chloride: 103 mEq/L (ref 96–112)
Creatinine, Ser: 1.25 mg/dL (ref 0.50–1.35)
GFR calc Af Amer: 57 mL/min — ABNORMAL LOW (ref >90)
GFR calc non Af Amer: 50 mL/min — ABNORMAL LOW (ref >90)
Glucose, Bld: 150 mg/dL — ABNORMAL HIGH (ref 70–99)
Total Bilirubin: 0.7 mg/dL (ref 0.3–1.2)

## 2012-05-08 LAB — CBC WITH DIFFERENTIAL/PLATELET
Basophils Absolute: 0 10*3/uL (ref 0.0–0.1)
Eosinophils Relative: 0 % (ref 0–5)
HCT: 43.2 % (ref 39.0–52.0)
Hemoglobin: 14.6 g/dL (ref 13.0–17.0)
Lymphocytes Relative: 6 % — ABNORMAL LOW (ref 12–46)
Lymphs Abs: 0.8 10*3/uL (ref 0.7–4.0)
MCV: 91.3 fL (ref 78.0–100.0)
Monocytes Absolute: 0.6 10*3/uL (ref 0.1–1.0)
Monocytes Relative: 4 % (ref 3–12)
Neutro Abs: 12.4 10*3/uL — ABNORMAL HIGH (ref 1.7–7.7)
RBC: 4.73 MIL/uL (ref 4.22–5.81)
WBC: 13.8 10*3/uL — ABNORMAL HIGH (ref 4.0–10.5)

## 2012-05-08 LAB — LACTIC ACID, PLASMA: Lactic Acid, Venous: 1.7 mmol/L (ref 0.5–2.2)

## 2012-05-08 MED ORDER — POLYETHYLENE GLYCOL 3350 17 GM/SCOOP PO POWD: 17.0000 g | Freq: Every day | ORAL | Status: DC

## 2012-05-08 MED ORDER — BISACODYL 5 MG PO TBEC: 5.0000 mg | DELAYED_RELEASE_TABLET | Freq: Two times a day (BID) | ORAL | Status: DC

## 2012-05-08 MED ORDER — SODIUM CHLORIDE 0.9 % IV BOLUS (SEPSIS)
500.0000 mL | Freq: Once | INTRAVENOUS | Status: AC
  Administered 2012-05-08: 500 mL via INTRAVENOUS

## 2012-05-08 MED ORDER — ONDANSETRON HCL 4 MG/2ML IJ SOLN
4.0000 mg | Freq: Once | INTRAMUSCULAR | Status: AC
  Administered 2012-05-08: 4 mg via INTRAVENOUS
  Filled 2012-05-08: qty 2

## 2012-05-08 MED ORDER — ONDANSETRON HCL 4 MG PO TABS: 4.0000 mg | ORAL_TABLET | Freq: Four times a day (QID) | ORAL | Status: DC | PRN

## 2012-05-08 NOTE — ED Notes (Signed)
Pt reports onset abd pain Nausea with dry heavies and diarrhea two days ago  Worse today

## 2012-05-08 NOTE — ED Notes (Signed)
Pt. Contacted wife to arrange transportation home.  Pt states will follow up with PCP in 2 days.

## 2012-05-08 NOTE — ED Provider Notes (Signed)
History     CSN: [id number]  Arrival date & time 05/08/12  0106   First MD Initiated Contact with Patient 05/08/12 0112      Chief Complaint  Patient presents with  . Nausea  . Diarrhea  . Abdominal Pain    (Consider location/radiation/quality/duration/timing/severity/associated sxs/prior treatment) HPIRobert F Mann is a 78 y.o. male presenting with 7/10 sharp, intermittent, occasionally crampy pain iabout 2-3 cm above the umbilicus - it started yesterday after he was eating some chili. He says he hasn't eat anything today but has been drinking water. He's had some nausea and some dry heaves earlier this morning. Patient has had a history of diarrhea. He's had a history of abdominal surgery where he had a growth in part of his large intestine and his appendix was removed. He's had no fevers, no chills, episodes of diarrhea 2 days ago-loose stools, but they have since resolved.   Past Medical History  Diagnosis Date  . Hypertension   . Coronary artery disease     Past Surgical History  Procedure Date  . A-v cardiac pacemaker insertion     History reviewed. No pertinent family history.  History  Substance Use Topics  . Smoking status: Never Smoker   . Smokeless tobacco: Not on file  . Alcohol Use: No      Review of Systems At least 10pt or greater review of systems completed and are negative except where specified in the HPI.  Allergies  Review of patient's allergies indicates no known allergies.  Home Medications   Current Outpatient Rx  Name Route Sig Dispense Refill  . METOPROLOL SUCCINATE ER 50 MG PO TB24 Oral Take 50 mg by mouth 2 (two) times daily. Take with or immediately following a meal.    . ROSUVASTATIN CALCIUM 20 MG PO TABS Oral Take 20 mg by mouth daily.    . WARFARIN SODIUM 5 MG PO TABS Oral Take 2.5-5 mg by mouth daily. Take 1 tablet every day except on mondays take 1/2 tablet      BP 133/78  Pulse 70  Temp 97.5 F (36.4 C) (Oral)  Resp 20   SpO2 97%  Physical Exam     Nursing notes reviewed.  Electronic medical record reviewed. VITAL SIGNS:   Filed Vitals:   05/08/12 0106 05/08/12 0130  BP: 134/66 133/78  Pulse:  70  Temp: 97.5 F (36.4 C)   TempSrc: Oral   Resp: 20 20  SpO2: 97% 97%   CONSTITUTIONAL: Awake, oriented, appears non-toxic HENT: Atraumatic, normocephalic, oral mucosa pink and moist, airway patent. Nares patent without drainage. External ears normal. EYES: Conjunctiva clear, EOMI, PERRLA NECK: Trachea midline, non-tender, supple CARDIOVASCULAR: Normal heart rate, Normal rhythm, No murmurs, rubs, gallops PULMONARY/CHEST: Clear to auscultation, no rhonchi, wheezes, or rales. Symmetrical breath sounds. Non-tender. ABDOMINAL: Non-distended, soft, non-tender - no rebound or guarding. Well-healed laparotomy scar inferior to the umbilicus.   BS normal. NEUROLOGIC: Non-focal, moving all four extremities, no gross sensory or motor deficits. EXTREMITIES: No clubbing, cyanosis, or edema SKIN: Warm, Dry, No erythema, No rash  ED Course  Procedures (including critical care time)  Labs Reviewed  CBC WITH DIFFERENTIAL - Abnormal; Notable for the following:    WBC 13.8 (*)     Neutrophils Relative 90 (*)     Neutro Abs 12.4 (*)     Lymphocytes Relative 6 (*)     All other components within normal limits  COMPREHENSIVE METABOLIC PANEL - Abnormal; Notable for the following:  Potassium 5.2 (*)     Glucose, Bld 150 (*)     BUN 28 (*)     GFR calc non Af Amer 50 (*)     GFR calc Af Amer 57 (*)     All other components within normal limits  LIPASE, BLOOD  LACTIC ACID, PLASMA   Dg Abd 2 Views  05/08/2012  *RADIOLOGY REPORT*  Clinical Data: Abdominal pain.  ABDOMEN - 2 VIEW  Comparison: CT abdomen pelvis 03/08/2008.  Findings: There is no free intraperitoneal air.  Bowel gas pattern is nonobstructive.  Moderate stool burden transverse colon is noted.  Suture material is seen in the right upper quadrant.  Round  radiopaque density in the right upper quadrant appears to be within bowel the second image.  IMPRESSION: No acute finding.   Original Report Authenticated By: Bernadene Bell. D'ALESSIO, M.D.      1. Abdominal pain, acute, periumbilical   2. Nausea   3. Constipation       MDM  Samuel Mann is a 78 y.o. male  patient presenting with some periumbilical pain and nausea, patient appears nontoxic, afebrile, comfortable resting in bed. Patient's intermittent pain is consistent with presentation of constipation however we'll check a lactate with concern for mesenteric ischemia after eating, as well as CMP and lipase.  Patient's white blood cell count is mildly elevated at 13.8 this is nonspecific and may be secondary to dry heaving. Patient's several mildly elevated potassium of 5.2 other labs are otherwise unremarkable.   Patient has a nonobstructive bowel gas pattern with a moderate stool burden in the transverse colon. This is commensurate with the patient pain has been. Patient on a bowel regimen.  Doubt any intra-abdominal emergency at this time, no obstruction, no pancreatitis, no free air, doubt perforation with patient's benign abdomen.  I explained the diagnosis and have given explicit precautions to return to the ER including vomiting, blood in stool or any other new or worsening symptoms. The patient understands and accepts the medical plan as it's been dictated and I have answered their questions. Discharge instructions concerning home care and prescriptions have been given.  The patient is STABLE and is discharged to home in good condition.         Jones Skene, MD 05/08/12 [phone]

## 2012-05-08 NOTE — ED Notes (Addendum)
Alert, interactive, calm, NAD, mentions abd pain remains, no changes, (denies: sob, dizziness or nausea), states, "OK with plan of d/c", denies questions, "needs to call wife for ride home" (arrived by EMS).

## 2012-05-08 NOTE — ED Notes (Signed)
EDP in to see pt, at BS.  °

## 2012-05-10 DIAGNOSIS — K5900 Constipation, unspecified: Secondary | ICD-10-CM | POA: Diagnosis not present

## 2012-05-10 DIAGNOSIS — E860 Dehydration: Secondary | ICD-10-CM | POA: Diagnosis not present

## 2012-05-17 DIAGNOSIS — Z7901 Long term (current) use of anticoagulants: Secondary | ICD-10-CM | POA: Diagnosis not present

## 2012-05-17 DIAGNOSIS — I4891 Unspecified atrial fibrillation: Secondary | ICD-10-CM | POA: Diagnosis not present

## 2012-06-04 DIAGNOSIS — Z7901 Long term (current) use of anticoagulants: Secondary | ICD-10-CM | POA: Diagnosis not present

## 2012-06-04 DIAGNOSIS — I4891 Unspecified atrial fibrillation: Secondary | ICD-10-CM | POA: Diagnosis not present

## 2012-06-15 DIAGNOSIS — R1013 Epigastric pain: Secondary | ICD-10-CM | POA: Diagnosis not present

## 2012-06-15 DIAGNOSIS — R1110 Vomiting, unspecified: Secondary | ICD-10-CM | POA: Diagnosis not present

## 2012-06-15 DIAGNOSIS — J310 Chronic rhinitis: Secondary | ICD-10-CM | POA: Diagnosis not present

## 2012-06-18 DIAGNOSIS — I4891 Unspecified atrial fibrillation: Secondary | ICD-10-CM | POA: Diagnosis not present

## 2012-06-18 DIAGNOSIS — Z7901 Long term (current) use of anticoagulants: Secondary | ICD-10-CM | POA: Diagnosis not present

## 2012-06-20 DIAGNOSIS — I10 Essential (primary) hypertension: Secondary | ICD-10-CM | POA: Diagnosis not present

## 2012-06-29 DIAGNOSIS — E875 Hyperkalemia: Secondary | ICD-10-CM | POA: Diagnosis not present

## 2012-07-16 DIAGNOSIS — I4891 Unspecified atrial fibrillation: Secondary | ICD-10-CM | POA: Diagnosis not present

## 2012-07-16 DIAGNOSIS — Z7901 Long term (current) use of anticoagulants: Secondary | ICD-10-CM | POA: Diagnosis not present

## 2012-07-23 DIAGNOSIS — I4891 Unspecified atrial fibrillation: Secondary | ICD-10-CM | POA: Diagnosis not present

## 2012-07-23 DIAGNOSIS — I472 Ventricular tachycardia: Secondary | ICD-10-CM | POA: Diagnosis not present

## 2012-08-25 ENCOUNTER — Other Ambulatory Visit: Payer: Self-pay

## 2012-08-29 DIAGNOSIS — I4891 Unspecified atrial fibrillation: Secondary | ICD-10-CM | POA: Diagnosis not present

## 2012-08-29 DIAGNOSIS — Z7901 Long term (current) use of anticoagulants: Secondary | ICD-10-CM | POA: Diagnosis not present

## 2012-09-26 ENCOUNTER — Ambulatory Visit: Payer: Self-pay | Admitting: Cardiovascular Disease

## 2012-09-26 DIAGNOSIS — I4891 Unspecified atrial fibrillation: Secondary | ICD-10-CM | POA: Diagnosis not present

## 2012-09-26 DIAGNOSIS — Z7901 Long term (current) use of anticoagulants: Secondary | ICD-10-CM | POA: Diagnosis not present

## 2012-10-24 DIAGNOSIS — Z7901 Long term (current) use of anticoagulants: Secondary | ICD-10-CM | POA: Diagnosis not present

## 2012-10-24 DIAGNOSIS — I4891 Unspecified atrial fibrillation: Secondary | ICD-10-CM | POA: Diagnosis not present

## 2012-10-29 ENCOUNTER — Encounter: Payer: Self-pay | Admitting: *Deleted

## 2012-10-29 DIAGNOSIS — I509 Heart failure, unspecified: Secondary | ICD-10-CM | POA: Diagnosis not present

## 2012-10-29 DIAGNOSIS — I4891 Unspecified atrial fibrillation: Secondary | ICD-10-CM | POA: Diagnosis not present

## 2012-10-29 DIAGNOSIS — I472 Ventricular tachycardia: Secondary | ICD-10-CM | POA: Diagnosis not present

## 2012-11-01 DIAGNOSIS — E7800 Pure hypercholesterolemia, unspecified: Secondary | ICD-10-CM | POA: Diagnosis not present

## 2012-11-01 DIAGNOSIS — Z0000 Encounter for general adult medical examination without abnormal findings: Secondary | ICD-10-CM | POA: Diagnosis not present

## 2012-11-01 DIAGNOSIS — Z125 Encounter for screening for malignant neoplasm of prostate: Secondary | ICD-10-CM | POA: Diagnosis not present

## 2012-11-01 DIAGNOSIS — Z79899 Other long term (current) drug therapy: Secondary | ICD-10-CM | POA: Diagnosis not present

## 2012-11-01 DIAGNOSIS — I10 Essential (primary) hypertension: Secondary | ICD-10-CM | POA: Diagnosis not present

## 2012-11-07 DIAGNOSIS — E785 Hyperlipidemia, unspecified: Secondary | ICD-10-CM | POA: Diagnosis not present

## 2012-11-07 DIAGNOSIS — I428 Other cardiomyopathies: Secondary | ICD-10-CM | POA: Diagnosis not present

## 2012-11-07 DIAGNOSIS — I10 Essential (primary) hypertension: Secondary | ICD-10-CM | POA: Diagnosis not present

## 2012-11-07 DIAGNOSIS — J310 Chronic rhinitis: Secondary | ICD-10-CM | POA: Diagnosis not present

## 2012-11-07 DIAGNOSIS — H6120 Impacted cerumen, unspecified ear: Secondary | ICD-10-CM | POA: Diagnosis not present

## 2012-11-07 DIAGNOSIS — M1710 Unilateral primary osteoarthritis, unspecified knee: Secondary | ICD-10-CM | POA: Diagnosis not present

## 2012-11-22 DIAGNOSIS — M810 Age-related osteoporosis without current pathological fracture: Secondary | ICD-10-CM | POA: Diagnosis not present

## 2012-11-29 DIAGNOSIS — Z79899 Other long term (current) drug therapy: Secondary | ICD-10-CM | POA: Diagnosis not present

## 2012-11-29 DIAGNOSIS — M810 Age-related osteoporosis without current pathological fracture: Secondary | ICD-10-CM | POA: Diagnosis not present

## 2012-11-29 DIAGNOSIS — M1710 Unilateral primary osteoarthritis, unspecified knee: Secondary | ICD-10-CM | POA: Diagnosis not present

## 2012-11-29 DIAGNOSIS — I10 Essential (primary) hypertension: Secondary | ICD-10-CM | POA: Diagnosis not present

## 2012-12-04 DIAGNOSIS — M25569 Pain in unspecified knee: Secondary | ICD-10-CM | POA: Diagnosis not present

## 2012-12-05 ENCOUNTER — Ambulatory Visit: Payer: PRIVATE HEALTH INSURANCE | Admitting: Pharmacist Clinician (PhC)/ Clinical Pharmacy Specialist

## 2012-12-06 ENCOUNTER — Ambulatory Visit (INDEPENDENT_AMBULATORY_CARE_PROVIDER_SITE_OTHER): Payer: Medicare Other | Admitting: Pharmacist Clinician (PhC)/ Clinical Pharmacy Specialist

## 2012-12-06 VITALS — BP 140/80 | HR 88

## 2012-12-06 DIAGNOSIS — Z7901 Long term (current) use of anticoagulants: Secondary | ICD-10-CM

## 2012-12-06 DIAGNOSIS — I4891 Unspecified atrial fibrillation: Secondary | ICD-10-CM

## 2012-12-24 DIAGNOSIS — Z9849 Cataract extraction status, unspecified eye: Secondary | ICD-10-CM | POA: Diagnosis not present

## 2012-12-24 DIAGNOSIS — H40019 Open angle with borderline findings, low risk, unspecified eye: Secondary | ICD-10-CM | POA: Diagnosis not present

## 2012-12-24 DIAGNOSIS — H01009 Unspecified blepharitis unspecified eye, unspecified eyelid: Secondary | ICD-10-CM | POA: Diagnosis not present

## 2012-12-25 DIAGNOSIS — I10 Essential (primary) hypertension: Secondary | ICD-10-CM | POA: Diagnosis not present

## 2013-01-24 ENCOUNTER — Ambulatory Visit (INDEPENDENT_AMBULATORY_CARE_PROVIDER_SITE_OTHER): Payer: Medicare Other | Admitting: Pharmacist Clinician (PhC)/ Clinical Pharmacy Specialist

## 2013-01-24 VITALS — BP 156/100 | HR 96

## 2013-01-24 DIAGNOSIS — I4891 Unspecified atrial fibrillation: Secondary | ICD-10-CM | POA: Diagnosis not present

## 2013-01-24 DIAGNOSIS — Z7901 Long term (current) use of anticoagulants: Secondary | ICD-10-CM

## 2013-01-24 LAB — POCT INR: INR: 2.2

## 2013-01-27 ENCOUNTER — Other Ambulatory Visit: Payer: Self-pay | Admitting: Cardiovascular Disease

## 2013-01-29 ENCOUNTER — Other Ambulatory Visit: Payer: Self-pay | Admitting: Cardiovascular Disease

## 2013-01-29 DIAGNOSIS — I498 Other specified cardiac arrhythmias: Secondary | ICD-10-CM | POA: Diagnosis not present

## 2013-02-05 ENCOUNTER — Encounter: Payer: Self-pay | Admitting: *Deleted

## 2013-02-05 LAB — REMOTE PACEMAKER DEVICE
AL IMPEDENCE PM: 429 Ohm
ATRIAL PACING PM: 95.8
BAMS-0001: 175 {beats}/min
RV LEAD THRESHOLD: 0.5 V
VENTRICULAR PACING PM: 8.7

## 2013-02-20 DIAGNOSIS — J3489 Other specified disorders of nose and nasal sinuses: Secondary | ICD-10-CM | POA: Diagnosis not present

## 2013-03-05 ENCOUNTER — Other Ambulatory Visit: Payer: Self-pay | Admitting: Cardiovascular Disease

## 2013-03-07 ENCOUNTER — Ambulatory Visit (INDEPENDENT_AMBULATORY_CARE_PROVIDER_SITE_OTHER): Payer: Medicare Other | Admitting: Pharmacist Clinician (PhC)/ Clinical Pharmacy Specialist

## 2013-03-07 VITALS — BP 140/74 | HR 76

## 2013-03-07 DIAGNOSIS — I4891 Unspecified atrial fibrillation: Secondary | ICD-10-CM

## 2013-03-07 DIAGNOSIS — Z7901 Long term (current) use of anticoagulants: Secondary | ICD-10-CM

## 2013-04-03 DIAGNOSIS — J309 Allergic rhinitis, unspecified: Secondary | ICD-10-CM | POA: Diagnosis not present

## 2013-04-03 DIAGNOSIS — Z23 Encounter for immunization: Secondary | ICD-10-CM | POA: Diagnosis not present

## 2013-04-18 ENCOUNTER — Ambulatory Visit (INDEPENDENT_AMBULATORY_CARE_PROVIDER_SITE_OTHER): Payer: Medicare Other | Admitting: Pharmacist Clinician (PhC)/ Clinical Pharmacy Specialist

## 2013-04-18 VITALS — BP 114/62 | HR 69

## 2013-04-18 DIAGNOSIS — Z7901 Long term (current) use of anticoagulants: Secondary | ICD-10-CM | POA: Diagnosis not present

## 2013-04-18 DIAGNOSIS — I4891 Unspecified atrial fibrillation: Secondary | ICD-10-CM | POA: Diagnosis not present

## 2013-04-18 LAB — POCT INR: INR: 2.1

## 2013-05-03 ENCOUNTER — Ambulatory Visit: Payer: Medicare Other

## 2013-05-09 ENCOUNTER — Encounter: Payer: Self-pay | Admitting: *Deleted

## 2013-05-09 DIAGNOSIS — Z79899 Other long term (current) drug therapy: Secondary | ICD-10-CM | POA: Diagnosis not present

## 2013-05-09 DIAGNOSIS — E7800 Pure hypercholesterolemia, unspecified: Secondary | ICD-10-CM | POA: Diagnosis not present

## 2013-05-09 LAB — REMOTE PACEMAKER DEVICE
AL IMPEDENCE PM: 460 Ohm
AL THRESHOLD: 0.625 V
BATTERY VOLTAGE: 2.79 V
RV LEAD AMPLITUDE: 8 mv

## 2013-05-13 ENCOUNTER — Encounter: Payer: Self-pay | Admitting: Cardiovascular Disease

## 2013-05-16 DIAGNOSIS — L821 Other seborrheic keratosis: Secondary | ICD-10-CM | POA: Diagnosis not present

## 2013-05-16 DIAGNOSIS — Z85828 Personal history of other malignant neoplasm of skin: Secondary | ICD-10-CM | POA: Diagnosis not present

## 2013-05-16 DIAGNOSIS — D1801 Hemangioma of skin and subcutaneous tissue: Secondary | ICD-10-CM | POA: Diagnosis not present

## 2013-05-16 DIAGNOSIS — L723 Sebaceous cyst: Secondary | ICD-10-CM | POA: Diagnosis not present

## 2013-05-30 ENCOUNTER — Ambulatory Visit (INDEPENDENT_AMBULATORY_CARE_PROVIDER_SITE_OTHER): Payer: Medicare Other | Admitting: Pharmacist Clinician (PhC)/ Clinical Pharmacy Specialist

## 2013-05-30 VITALS — BP 140/78 | HR 96

## 2013-05-30 DIAGNOSIS — I4891 Unspecified atrial fibrillation: Secondary | ICD-10-CM

## 2013-05-30 DIAGNOSIS — Z7901 Long term (current) use of anticoagulants: Secondary | ICD-10-CM

## 2013-06-07 ENCOUNTER — Encounter: Payer: Self-pay | Admitting: Emergency Medicine

## 2013-06-07 ENCOUNTER — Emergency Department (INDEPENDENT_AMBULATORY_CARE_PROVIDER_SITE_OTHER)
Admission: EM | Admit: 2013-06-07 | Discharge: 2013-06-07 | Disposition: A | Discharge status: Home / Self Care | Payer: Medicare Other | Source: Home / Self Care | Attending: Family Medicine | Admitting: Family Medicine

## 2013-06-07 DIAGNOSIS — J029 Acute pharyngitis, unspecified: Secondary | ICD-10-CM | POA: Diagnosis not present

## 2013-06-07 MED ORDER — AMOXICILLIN 875 MG PO TABS: 875.0000 mg | ORAL_TABLET | Freq: Two times a day (BID) | ORAL | Status: DC

## 2013-06-07 NOTE — ED Provider Notes (Signed)
CSN: [id number]     Arrival date & time 06/07/13  1008 History   First MD Initiated Contact with Patient 06/07/13 1028     Chief Complaint  Patient presents with  . Sore Throat      HPI Comments: Patient complains of onset of sore throat last night.  He has had minimal other symptoms except nasal congestion.  The history is provided by the patient.    Past Medical History  Diagnosis Date  . Hypertension   . Coronary artery disease    Past Surgical History  Procedure Laterality Date  . A-v cardiac pacemaker insertion     No family history on file. History  Substance Use Topics  . Smoking status: Never Smoker   . Smokeless tobacco: Not on file  . Alcohol Use: No    Review of Systems No sore throat No cough No pleuritic pain No wheezing + nasal congestion ? post-nasal drainage No sinus pain/pressure No itchy/red eyes No earache No hemoptysis + SOB No fever/chills No nausea No vomiting No abdominal pain No diarrhea No urinary symptoms No skin rash + fatigue No myalgias No headache Used OTC meds without relief  Allergies  Review of patient's allergies indicates not on file.  Home Medications   Current Outpatient Rx  Name  Route  Sig  Dispense  Refill  . amoxicillin (AMOXIL) 875 MG tablet   Oral   Take 1 tablet (875 mg total) by mouth 2 (two) times daily. (Rx void after 06/15/13)   20 tablet   0   . bisacodyl (DULCOLAX) 5 MG EC tablet   Oral   Take 1 tablet (5 mg total) by mouth 2 (two) times daily.   14 tablet   0   . Calcium-Vitamin D (CALTRATE 600 PLUS-VIT D PO)   Oral   Take 600 mg by mouth 2 (two) times daily.         . metoprolol succinate (TOPROL-XL) 50 MG 24 hr tablet   Oral   Take 50 mg by mouth daily. Take with or immediately following a meal.         . ondansetron (ZOFRAN) 4 MG tablet   Oral   Take 1 tablet (4 mg total) by mouth every 6 (six) hours as needed for nausea.   12 tablet   0   . polyethylene glycol powder  (GLYCOLAX) powder   Oral   Take 17 g by mouth daily.   255 g   0   . rosuvastatin (CRESTOR) 20 MG tablet   Oral   Take 20 mg by mouth daily.         Marland Kitchen warfarin (COUMADIN) 5 MG tablet      TAKE AS DIRECTED   45 tablet   6    BP 153/83  Pulse 88  Temp(Src) 98.1 F (36.7 C) (Oral)  Ht 5\' 7"  (1.702 m)  Wt 166 lb (75.297 kg)  BMI 25.99 kg/m2  SpO2 99% Physical Exam Nursing notes and Vital Signs reviewed. Appearance:  Patient appears healthy, stated age, and in no acute distress Eyes:  Pupils are equal, round, and reactive to light and accomodation.  Extraocular movement is intact.  Conjunctivae are not inflamed  Ears:  Canals normal.  Tympanic membranes normal.  Nose:  Mildly congested turbinates.  No sinus tenderness.   Pharynx:  Minimal erythema Neck:  Supple.  Slightly tender shotty posterior nodes are palpated bilaterally  Lungs:  Clear to auscultation.  Breath sounds are equal.  Heart:  Regular rate and rhythm without murmurs, rubs, or gallops.  Abdomen:  Nontender without masses or hepatosplenomegaly.  Bowel sounds are present.  No CVA or flank tenderness.  Extremities:  No edema.  No calf tenderness Skin:  No rash present.   ED Course  Procedures  none    Labs Reviewed  STREP A DNA PROBE POCT rapid strep test negative         MDM   1. Acute pharyngitis; suspect viral URI    Throat culture pending There is no evidence of bacterial infection today.  Treat symptomatically for now  If increasing cold symptoms develop with cough and nasal congestion: Begin plain Mucinex (guaifenesin) twice daily for cough and congestion.  Increase fluid intake, rest. May use Afrin nasal spray (or generic oxymetazoline) twice daily for about 5 days.  Also recommend using saline nasal spray several times daily and saline nasal irrigation (AYR is a common brand).  Continue Atrovent nasal spray. Try warm salt water gargles. Stop all antihistamines for now, and other  non-prescription cough/cold preparations. May take Delsym Cough Suppressant at bedtime for nighttime cough.  Begin Amoxicillin if not improving about 5 days or if persistent fever develops. Follow-up with family doctor if not improving 7 to 10 days.     Lattie Haw, MD 06/08/13 [phone]

## 2013-06-07 NOTE — ED Notes (Signed)
Sore throat started last night

## 2013-06-08 LAB — STREP A DNA PROBE: GASP: NEGATIVE

## 2013-06-10 ENCOUNTER — Telehealth: Payer: Self-pay | Admitting: Emergency Medicine

## 2013-06-10 LAB — POCT RAPID STREP A (OFFICE): Rapid Strep A Screen: NEGATIVE

## 2013-06-17 ENCOUNTER — Telehealth: Payer: Self-pay | Admitting: Pharmacist Clinician (PhC)/ Clinical Pharmacy Specialist

## 2013-06-17 NOTE — Telephone Encounter (Signed)
lft msg for pt to call and schedule next coumadin visit the first part of january.

## 2013-07-18 ENCOUNTER — Ambulatory Visit (INDEPENDENT_AMBULATORY_CARE_PROVIDER_SITE_OTHER): Payer: Medicare Other | Admitting: Pharmacist Clinician (PhC)/ Clinical Pharmacy Specialist

## 2013-07-18 VITALS — BP 122/84 | HR 96

## 2013-07-18 DIAGNOSIS — I4891 Unspecified atrial fibrillation: Secondary | ICD-10-CM | POA: Diagnosis not present

## 2013-07-18 DIAGNOSIS — Z7901 Long term (current) use of anticoagulants: Secondary | ICD-10-CM | POA: Diagnosis not present

## 2013-07-18 LAB — POCT INR: INR: 2.2

## 2013-08-05 ENCOUNTER — Ambulatory Visit (INDEPENDENT_AMBULATORY_CARE_PROVIDER_SITE_OTHER): Payer: Medicare Other | Admitting: *Deleted

## 2013-08-05 DIAGNOSIS — I4891 Unspecified atrial fibrillation: Secondary | ICD-10-CM | POA: Diagnosis not present

## 2013-08-05 LAB — PACEMAKER DEVICE OBSERVATION

## 2013-08-05 LAB — MDC_IDC_ENUM_SESS_TYPE_REMOTE
Battery Impedance: 206 Ohm
Brady Statistic AP VP Percent: 5 %
Brady Statistic AP VS Percent: 92 %
Brady Statistic AS VS Percent: 3 %
Date Time Interrogation Session: 20150126144806
Lead Channel Impedance Value: 436 Ohm
Lead Channel Impedance Value: 642 Ohm
Lead Channel Pacing Threshold Amplitude: 0.625 V
Lead Channel Pacing Threshold Pulse Width: 0.4 ms
Lead Channel Setting Pacing Amplitude: 2 V
MDC IDC MSMT BATTERY REMAINING LONGEVITY: 128 mo
MDC IDC MSMT BATTERY VOLTAGE: 2.8 V
MDC IDC MSMT LEADCHNL RV PACING THRESHOLD AMPLITUDE: 0.625 V
MDC IDC MSMT LEADCHNL RV PACING THRESHOLD PULSEWIDTH: 0.4 ms
MDC IDC MSMT LEADCHNL RV SENSING INTR AMPL: 8 mV
MDC IDC SET LEADCHNL RA PACING AMPLITUDE: 1.5 V
MDC IDC SET LEADCHNL RV PACING PULSEWIDTH: 0.4 ms
MDC IDC SET LEADCHNL RV SENSING SENSITIVITY: 4 mV
MDC IDC STAT BRADY AS VP PERCENT: 0 %

## 2013-08-14 ENCOUNTER — Encounter: Payer: Self-pay | Admitting: *Deleted

## 2013-08-29 ENCOUNTER — Ambulatory Visit (INDEPENDENT_AMBULATORY_CARE_PROVIDER_SITE_OTHER): Payer: Medicare Other | Admitting: Pharmacist Clinician (PhC)/ Clinical Pharmacy Specialist

## 2013-08-29 VITALS — BP 140/84 | HR 92

## 2013-08-29 DIAGNOSIS — Z7901 Long term (current) use of anticoagulants: Secondary | ICD-10-CM

## 2013-08-29 DIAGNOSIS — I4891 Unspecified atrial fibrillation: Secondary | ICD-10-CM | POA: Diagnosis not present

## 2013-08-29 LAB — POCT INR: INR: 2.3

## 2013-10-10 ENCOUNTER — Ambulatory Visit: Payer: Medicare Other | Admitting: Pharmacist Clinician (PhC)/ Clinical Pharmacy Specialist

## 2013-10-11 ENCOUNTER — Ambulatory Visit (INDEPENDENT_AMBULATORY_CARE_PROVIDER_SITE_OTHER): Payer: Medicare Other | Admitting: Pharmacist Clinician (PhC)/ Clinical Pharmacy Specialist

## 2013-10-11 VITALS — BP 146/84 | HR 80

## 2013-10-11 DIAGNOSIS — I4891 Unspecified atrial fibrillation: Secondary | ICD-10-CM

## 2013-10-11 DIAGNOSIS — Z7901 Long term (current) use of anticoagulants: Secondary | ICD-10-CM

## 2013-10-11 LAB — POCT INR: INR: 2.3

## 2013-10-16 ENCOUNTER — Encounter: Payer: Self-pay | Admitting: *Deleted

## 2013-10-25 ENCOUNTER — Ambulatory Visit (INDEPENDENT_AMBULATORY_CARE_PROVIDER_SITE_OTHER): Payer: Medicare Other | Admitting: Cardiovascular Disease

## 2013-10-25 ENCOUNTER — Encounter: Payer: Self-pay | Admitting: Cardiovascular Disease

## 2013-10-25 VITALS — BP 130/78 | HR 95 | Resp 16 | Ht 66.0 in | Wt 168.1 lb

## 2013-10-25 DIAGNOSIS — I428 Other cardiomyopathies: Secondary | ICD-10-CM

## 2013-10-25 DIAGNOSIS — E785 Hyperlipidemia, unspecified: Secondary | ICD-10-CM | POA: Diagnosis not present

## 2013-10-25 DIAGNOSIS — I4891 Unspecified atrial fibrillation: Secondary | ICD-10-CM

## 2013-10-25 DIAGNOSIS — Z950 Presence of cardiac pacemaker: Secondary | ICD-10-CM | POA: Diagnosis not present

## 2013-10-25 LAB — MDC_IDC_ENUM_SESS_TYPE_INCLINIC
Battery Impedance: 206 Ohm
Brady Statistic AP VS Percent: 93.2 %
Brady Statistic AS VS Percent: 2.6 %
Lead Channel Pacing Threshold Amplitude: 0.75 V
Lead Channel Pacing Threshold Amplitude: 0.75 V
Lead Channel Pacing Threshold Pulse Width: 0.4 ms
Lead Channel Pacing Threshold Pulse Width: 0.4 ms
Lead Channel Sensing Intrinsic Amplitude: 11.2 mV
Lead Channel Setting Pacing Amplitude: 2 V
Lead Channel Setting Sensing Sensitivity: 4 mV
MDC IDC MSMT BATTERY VOLTAGE: 2.8 V
MDC IDC MSMT LEADCHNL RA IMPEDANCE VALUE: 430 Ohm
MDC IDC MSMT LEADCHNL RA SENSING INTR AMPL: 2 mV
MDC IDC MSMT LEADCHNL RV IMPEDANCE VALUE: 672 Ohm
MDC IDC SET LEADCHNL RA PACING AMPLITUDE: 1.5 V
MDC IDC SET LEADCHNL RV PACING PULSEWIDTH: 0.4 ms
MDC IDC STAT BRADY AP VP PERCENT: 3.9 %
MDC IDC STAT BRADY AS VP PERCENT: 0.2 %

## 2013-10-25 LAB — PACEMAKER DEVICE OBSERVATION

## 2013-10-25 NOTE — Patient Instructions (Signed)
Remote monitoring is used to monitor your pacemaker from home. This monitoring reduces the number of office visits required to check your device to one time per year. It allows us to keep an eye on the functioning of your device to ensure it is working properly. You are scheduled for a device check from home on 01-27-2014. You may send your transmission at any time that day. If you have a wireless device, the transmission will be sent automatically. After your physician reviews your transmission, you will receive a postcard with your next transmission date.  Your physician recommends that you schedule a follow-up appointment in: 12 months with Dr.Croitoru  

## 2013-10-27 ENCOUNTER — Encounter: Payer: Self-pay | Admitting: Cardiovascular Disease

## 2013-10-27 DIAGNOSIS — Z950 Presence of cardiac pacemaker: Secondary | ICD-10-CM | POA: Insufficient documentation

## 2013-10-27 DIAGNOSIS — E785 Hyperlipidemia, unspecified: Secondary | ICD-10-CM | POA: Insufficient documentation

## 2013-10-27 DIAGNOSIS — I428 Other cardiomyopathies: Secondary | ICD-10-CM | POA: Insufficient documentation

## 2013-10-27 NOTE — Assessment & Plan Note (Signed)
PVC burden has substantially diminished. He has no clinical manifestations of congestive heart failure. Trying to avoid polypharmacy in this 80 year-old. We'll not start ACE inhibitors unless heart failure becomes evident. NYHA class I-II at worst.  Has not required diuretic therapy.

## 2013-10-27 NOTE — Progress Notes (Signed)
Patient ID: Samuel Pellegrini Sr., male   DOB: 04-28-1922, 80 y.o.   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 In 2004 coronary angiography showed noncritical CAD. He has had paroxysmal atrial fibrillation and had a dual chamber permanent pacemaker implanted in 2011 for symptomatic sinus bradycardia. Also has a remote history of lower extremity DVT. He is on chronic warfarin anticoagulation without bleeding complications or history of stroke or other embolic events. Today he is here for an annual followup and has no complaints of chest pain, edema, palpitations, syncope or dizziness. He has chronic class II shortness of breath, unchanged from his usual pattern.  Interrogation of his pacemaker today shows normal device function. His presenting rhythm is atrial paced ventricular sensed and atrial pacing occurs 97% of the time. Ventricular pacing is rare, 4% of the time. No episodes of atrial fibrillation have been reported since his last device check, although a few brief episodes of atrial tachycardia of 275 beats per minute (maximum 1-1/2 minutes) have been detected. He has also had brief episodes of nonsustained ventricular tachycardia lasting up to 21 beats. He has not tolerated higher doses of beta blockers.  No Known Allergies  Current Outpatient Prescriptions  Medication Sig Dispense Refill  . bisacodyl (DULCOLAX) 5 MG EC tablet Take 1 tablet (5 mg total) by mouth 2 (two) times daily.  14 tablet  0  . Calcium-Vitamin D (CALTRATE 600 PLUS-VIT D PO) Take 600 mg by mouth 2 (two) times daily.      . metoprolol succinate (TOPROL-XL) 50 MG 24 hr tablet Take 50 mg by mouth daily. Take with or immediately following a meal.      . polyethylene glycol powder (GLYCOLAX) powder Take 17 g by mouth daily.  255 g  0  . rosuvastatin (CRESTOR) 20 MG tablet Take 20 mg  by mouth daily.      Marland Kitchen warfarin (COUMADIN) 5 MG tablet TAKE AS DIRECTED  45 tablet  6  . ipratropium (ATROVENT) 0.03 % nasal spray Place 2 sprays into the nose daily.       No current facility-administered medications for this visit.    Past Medical History  Diagnosis Date  . Hypertension   . Coronary artery disease   . Ventricular arrhythmia   . PAF (paroxysmal atrial fibrillation)   . Nonischemic cardiomyopathy   . CHF (congestive heart failure)   . Dyslipidemia   . DVT (deep venous thrombosis)     H/O  . PVC's (premature ventricular contractions)     Frequent    Past Surgical History  Procedure Laterality Date  . A-v cardiac pacemaker insertion  02/23/2010    Medtronic  . Cardiac catheterization  04/21/2003    noncritical CAD  . US echocardiography  03/01/2011    pacer induced LBBB,mod. asymmetric LVH, ER 40-45%,mild AI,mild aortic root dilatation, aortic root sclerosis/ca+.  . Nm myocar perf wall motion  02/12/2010    normal    Family History  Problem Relation Age of Onset  . Leukemia Mother   . CVA Father   . Cancer Brother   . Breast cancer Sister     History   Social History  . Marital Status: Married    Spouse Name: N/A    Number of Children: N/A  . Years of Education: N/A   Occupational History  . Not  on file.   Social History Main Topics  . Smoking status: Never Smoker   . Smokeless tobacco: Not on file  . Alcohol Use: No  . Drug Use: No  . Sexual Activity:    Other Topics Concern  . Not on file   Social History Narrative  . No narrative on file    Review of systems: The patient specifically denies any chest pain at rest or with exertion, dyspnea at rest or with usualexertion, orthopnea, paroxysmal nocturnal dyspnea, syncope, palpitations, focal neurological deficits, intermittent claudication, lower extremity edema, unexplained weight gain, cough, hemoptysis or wheezing.  The patient also denies abdominal pain, nausea, vomiting, dysphagia,  diarrhea, constipation, polyuria, polydipsia, dysuria, hematuria, frequency, urgency, abnormal bleeding or bruising, fever, chills, unexpected weight changes, mood swings, change in skin or hair texture, change in voice quality, auditory or visual problems, allergic reactions or rashes, new musculoskeletal complaints other than usual "aches and pains".   PHYSICAL EXAM BP 130/78  Pulse 95  Resp 16  Ht 5\' 6"  (1.676 m)  Wt 168 lb 1.6 oz (76.25 kg)  BMI 27.15 kg/m2  General: Alert, oriented x3, no distress Head: no evidence of trauma, PERRL, EOMI, no exophtalmos or lid lag, no myxedema, no xanthelasma; normal ears, nose and oropharynx Neck: normal jugular venous pulsations and no hepatojugular reflux; brisk carotid pulses without delay and no carotid bruits Chest: clear to auscultation, no signs of consolidation by percussion or palpation, normal fremitus, symmetrical and full respiratory excursions, healthy left subclavian pacemaker site Cardiovascular: normal position and quality of the apical impulse, regular rhythm, normal first and second heart sounds, no murmurs, rubs or gallops Abdomen: no tenderness or distention, no masses by palpation, no abnormal pulsatility or arterial bruits, normal bowel sounds, no hepatosplenomegaly Extremities: no clubbing, cyanosis or edema; 2+ radial, ulnar and brachial pulses bilaterally; 2+ right femoral, posterior tibial and dorsalis pedis pulses; 2+ left femoral, posterior tibial and dorsalis pedis pulses; no subclavian or femoral bruits Neurological: grossly nonfocal   EKG: Atrial paced, ventricular sensed, long AV delay, no premature ventricular contractions  Lipid Panel  No recent results available  BMET    Component Value Date/Time   NA 139 05/08/2012 0148   K 5.2* 05/08/2012 0148   CL 103 05/08/2012 0148   CO2 23 05/08/2012 0148   GLUCOSE 150* 05/08/2012 0148   BUN 28* 05/08/2012 0148   CREATININE 1.25 05/08/2012 0148   CALCIUM 9.2  05/08/2012 0148   GFRNONAA 50* 05/08/2012 0148   GFRAA 57* 05/08/2012 0148     ASSESSMENT AND PLAN Atrial fibrillation No history of embolic events. Appropriately anticoagulated (also has a history of leg a DVT). No events in the last several months. On beta blocker therapy and maximum tolerated dose  Nonischemic cardiomyopathy - possibly PVC related PVC burden has substantially diminished. He has no clinical manifestations of congestive heart failure. Trying to avoid polypharmacy in this 80 year-old. We'll not start ACE inhibitors unless heart failure becomes evident. NYHA class I-II at worst.  Has not required diuretic therapy.  Pacemaker - dual chamber Medtronic 2011 Normal device function. Ventricular pacing is appropriately low. No change in the device settings today.  Hyperlipidemia On treatment with statin, labs usually monitored through Dr.Pharr's office.    Patient Instructions  Remote monitoring is used to monitor your pacemaker from home. This monitoring reduces the number of office visits required to check your device to one time per year. It allows [place] to keep an eye on the functioning of  your device to ensure it is working properly. You are scheduled for a device check from home on 01-27-2014. You may send your transmission at any time that day. If you have a wireless device, the transmission will be sent automatically. After your physician reviews your transmission, you will receive a postcard with your next transmission date.  Your physician recommends that you schedule a follow-up appointment in: 12 months with Dr.Malaina Mortellaro      Orders Placed This Encounter  Procedures  . EKG 12-Lead   Meds ordered this encounter  Medications  . ipratropium (ATROVENT) 0.03 % nasal spray    Sig: Place 2 sprays into the nose daily.    Tamina Cyphers  Sanda Klein, MD, [facility] [phone] office [phone] pager

## 2013-10-27 NOTE — Assessment & Plan Note (Signed)
Normal device function. Ventricular pacing is appropriately low. No change in the device settings today.

## 2013-10-27 NOTE — Assessment & Plan Note (Signed)
No history of embolic events. Appropriately anticoagulated (also has a history of leg a DVT). No events in the last several months. On beta blocker therapy and maximum tolerated dose

## 2013-10-27 NOTE — Assessment & Plan Note (Signed)
On treatment with statin, labs usually monitored through Dr.Pharr's office.

## 2013-11-13 DIAGNOSIS — Z0000 Encounter for general adult medical examination without abnormal findings: Secondary | ICD-10-CM | POA: Diagnosis not present

## 2013-11-13 DIAGNOSIS — Z125 Encounter for screening for malignant neoplasm of prostate: Secondary | ICD-10-CM | POA: Diagnosis not present

## 2013-11-13 DIAGNOSIS — E785 Hyperlipidemia, unspecified: Secondary | ICD-10-CM | POA: Diagnosis not present

## 2013-11-13 DIAGNOSIS — I10 Essential (primary) hypertension: Secondary | ICD-10-CM | POA: Diagnosis not present

## 2013-11-20 ENCOUNTER — Ambulatory Visit: Payer: Medicare Other | Admitting: Pharmacist Clinician (PhC)/ Clinical Pharmacy Specialist

## 2013-11-22 DIAGNOSIS — R3129 Other microscopic hematuria: Secondary | ICD-10-CM | POA: Diagnosis not present

## 2013-11-22 DIAGNOSIS — R0989 Other specified symptoms and signs involving the circulatory and respiratory systems: Secondary | ICD-10-CM | POA: Diagnosis not present

## 2013-11-22 DIAGNOSIS — E785 Hyperlipidemia, unspecified: Secondary | ICD-10-CM | POA: Diagnosis not present

## 2013-11-22 DIAGNOSIS — I129 Hypertensive chronic kidney disease with stage 1 through stage 4 chronic kidney disease, or unspecified chronic kidney disease: Secondary | ICD-10-CM | POA: Diagnosis not present

## 2013-11-22 DIAGNOSIS — I10 Essential (primary) hypertension: Secondary | ICD-10-CM | POA: Diagnosis not present

## 2013-11-22 DIAGNOSIS — J310 Chronic rhinitis: Secondary | ICD-10-CM | POA: Diagnosis not present

## 2013-11-22 DIAGNOSIS — Z7901 Long term (current) use of anticoagulants: Secondary | ICD-10-CM | POA: Diagnosis not present

## 2013-11-22 DIAGNOSIS — Z950 Presence of cardiac pacemaker: Secondary | ICD-10-CM | POA: Diagnosis not present

## 2013-11-22 DIAGNOSIS — E7800 Pure hypercholesterolemia, unspecified: Secondary | ICD-10-CM | POA: Diagnosis not present

## 2013-11-26 ENCOUNTER — Ambulatory Visit (INDEPENDENT_AMBULATORY_CARE_PROVIDER_SITE_OTHER): Payer: Medicare Other | Admitting: Pharmacist Clinician (PhC)/ Clinical Pharmacy Specialist

## 2013-11-26 DIAGNOSIS — I4891 Unspecified atrial fibrillation: Secondary | ICD-10-CM

## 2013-11-26 DIAGNOSIS — Z7901 Long term (current) use of anticoagulants: Secondary | ICD-10-CM | POA: Diagnosis not present

## 2013-11-26 LAB — POCT INR: INR: 3.5

## 2013-11-27 DIAGNOSIS — J9819 Other pulmonary collapse: Secondary | ICD-10-CM | POA: Diagnosis not present

## 2013-11-29 ENCOUNTER — Ambulatory Visit: Payer: Self-pay | Admitting: Pharmacist Clinician (PhC)/ Clinical Pharmacy Specialist

## 2013-11-29 ENCOUNTER — Telehealth: Payer: Self-pay | Admitting: *Deleted

## 2013-11-29 DIAGNOSIS — Z7901 Long term (current) use of anticoagulants: Secondary | ICD-10-CM

## 2013-11-29 DIAGNOSIS — I4891 Unspecified atrial fibrillation: Secondary | ICD-10-CM

## 2013-11-29 NOTE — Telephone Encounter (Signed)
Dr.C and Dr.Pharr decided that pt no longer needed to be on an anticoagulant since there hasn't been any substantial evidence of AF in >=2 years. Patient notified and voiced understanding. Tommy Medal, PharmD was also notified.

## 2013-12-16 ENCOUNTER — Ambulatory Visit: Payer: Medicare Other | Admitting: Pharmacist Clinician (PhC)/ Clinical Pharmacy Specialist

## 2014-01-07 DIAGNOSIS — R609 Edema, unspecified: Secondary | ICD-10-CM | POA: Diagnosis not present

## 2014-01-27 ENCOUNTER — Ambulatory Visit (INDEPENDENT_AMBULATORY_CARE_PROVIDER_SITE_OTHER): Payer: Medicare Other | Admitting: *Deleted

## 2014-01-27 ENCOUNTER — Encounter: Payer: Self-pay | Admitting: Cardiovascular Disease

## 2014-01-27 DIAGNOSIS — I4891 Unspecified atrial fibrillation: Secondary | ICD-10-CM

## 2014-01-27 NOTE — Progress Notes (Signed)
Remote pacemaker transmission.   

## 2014-01-31 LAB — MDC_IDC_ENUM_SESS_TYPE_REMOTE
Battery Impedance: 206 Ohm
Battery Remaining Longevity: 128 mo
Brady Statistic AP VP Percent: 1 %
Brady Statistic AS VS Percent: 3 %
Date Time Interrogation Session: 20150720145728
Lead Channel Impedance Value: 435 Ohm
Lead Channel Impedance Value: 686 Ohm
Lead Channel Pacing Threshold Pulse Width: 0.4 ms
Lead Channel Setting Pacing Amplitude: 1.5 V
Lead Channel Setting Pacing Amplitude: 2 V
Lead Channel Setting Pacing Pulse Width: 0.4 ms
Lead Channel Setting Sensing Sensitivity: 4 mV
MDC IDC MSMT BATTERY VOLTAGE: 2.8 V
MDC IDC MSMT LEADCHNL RA PACING THRESHOLD AMPLITUDE: 0.75 V
MDC IDC MSMT LEADCHNL RA PACING THRESHOLD PULSEWIDTH: 0.4 ms
MDC IDC MSMT LEADCHNL RV PACING THRESHOLD AMPLITUDE: 0.75 V
MDC IDC MSMT LEADCHNL RV SENSING INTR AMPL: 8 mV
MDC IDC STAT BRADY AP VS PERCENT: 97 %
MDC IDC STAT BRADY AS VP PERCENT: 0 %

## 2014-02-05 ENCOUNTER — Encounter: Payer: Self-pay | Admitting: Cardiology

## 2014-02-20 ENCOUNTER — Emergency Department (HOSPITAL_COMMUNITY): Payer: Medicare Other

## 2014-02-20 ENCOUNTER — Inpatient Hospital Stay (HOSPITAL_COMMUNITY): Payer: Medicare Other

## 2014-02-20 ENCOUNTER — Encounter (HOSPITAL_COMMUNITY): Payer: Self-pay | Admitting: General Practice

## 2014-02-20 ENCOUNTER — Inpatient Hospital Stay (HOSPITAL_COMMUNITY)
Admission: EM | Admit: 2014-02-20 | Discharge: 2014-02-25 | DRG: 389 | Disposition: A | Discharge status: Home / Self Care | Payer: Medicare Other | Attending: Internal Medicine | Admitting: Internal Medicine

## 2014-02-20 DIAGNOSIS — Z803 Family history of malignant neoplasm of breast: Secondary | ICD-10-CM

## 2014-02-20 DIAGNOSIS — E785 Hyperlipidemia, unspecified: Secondary | ICD-10-CM | POA: Diagnosis present

## 2014-02-20 DIAGNOSIS — I498 Other specified cardiac arrhythmias: Secondary | ICD-10-CM | POA: Diagnosis present

## 2014-02-20 DIAGNOSIS — I2510 Atherosclerotic heart disease of native coronary artery without angina pectoris: Secondary | ICD-10-CM | POA: Diagnosis present

## 2014-02-20 DIAGNOSIS — K565 Intestinal adhesions [bands], unspecified as to partial versus complete obstruction: Principal | ICD-10-CM | POA: Diagnosis present

## 2014-02-20 DIAGNOSIS — I472 Ventricular tachycardia, unspecified: Secondary | ICD-10-CM | POA: Diagnosis present

## 2014-02-20 DIAGNOSIS — M129 Arthropathy, unspecified: Secondary | ICD-10-CM | POA: Diagnosis present

## 2014-02-20 DIAGNOSIS — I482 Chronic atrial fibrillation, unspecified: Secondary | ICD-10-CM

## 2014-02-20 DIAGNOSIS — N179 Acute kidney failure, unspecified: Secondary | ICD-10-CM | POA: Diagnosis present

## 2014-02-20 DIAGNOSIS — Z823 Family history of stroke: Secondary | ICD-10-CM | POA: Diagnosis not present

## 2014-02-20 DIAGNOSIS — Z7901 Long term (current) use of anticoagulants: Secondary | ICD-10-CM

## 2014-02-20 DIAGNOSIS — E870 Hyperosmolality and hypernatremia: Secondary | ICD-10-CM | POA: Diagnosis present

## 2014-02-20 DIAGNOSIS — R1110 Vomiting, unspecified: Secondary | ICD-10-CM | POA: Diagnosis not present

## 2014-02-20 DIAGNOSIS — J90 Pleural effusion, not elsewhere classified: Secondary | ICD-10-CM | POA: Diagnosis not present

## 2014-02-20 DIAGNOSIS — K2970 Gastritis, unspecified, without bleeding: Secondary | ICD-10-CM | POA: Diagnosis not present

## 2014-02-20 DIAGNOSIS — R112 Nausea with vomiting, unspecified: Secondary | ICD-10-CM | POA: Diagnosis not present

## 2014-02-20 DIAGNOSIS — I480 Paroxysmal atrial fibrillation: Secondary | ICD-10-CM

## 2014-02-20 DIAGNOSIS — I4891 Unspecified atrial fibrillation: Secondary | ICD-10-CM | POA: Diagnosis present

## 2014-02-20 DIAGNOSIS — I4729 Other ventricular tachycardia: Secondary | ICD-10-CM | POA: Diagnosis present

## 2014-02-20 DIAGNOSIS — Z806 Family history of leukemia: Secondary | ICD-10-CM

## 2014-02-20 DIAGNOSIS — I509 Heart failure, unspecified: Secondary | ICD-10-CM | POA: Diagnosis present

## 2014-02-20 DIAGNOSIS — D72829 Elevated white blood cell count, unspecified: Secondary | ICD-10-CM | POA: Diagnosis present

## 2014-02-20 DIAGNOSIS — I10 Essential (primary) hypertension: Secondary | ICD-10-CM | POA: Diagnosis present

## 2014-02-20 DIAGNOSIS — Z86718 Personal history of other venous thrombosis and embolism: Secondary | ICD-10-CM | POA: Diagnosis not present

## 2014-02-20 DIAGNOSIS — R109 Unspecified abdominal pain: Secondary | ICD-10-CM | POA: Diagnosis not present

## 2014-02-20 DIAGNOSIS — K56609 Unspecified intestinal obstruction, unspecified as to partial versus complete obstruction: Secondary | ICD-10-CM | POA: Diagnosis present

## 2014-02-20 DIAGNOSIS — Z950 Presence of cardiac pacemaker: Secondary | ICD-10-CM | POA: Diagnosis not present

## 2014-02-20 DIAGNOSIS — I428 Other cardiomyopathies: Secondary | ICD-10-CM | POA: Diagnosis present

## 2014-02-20 DIAGNOSIS — Z4682 Encounter for fitting and adjustment of non-vascular catheter: Secondary | ICD-10-CM | POA: Diagnosis not present

## 2014-02-20 HISTORY — DX: Unspecified intestinal obstruction, unspecified as to partial versus complete obstruction: K56.609

## 2014-02-20 HISTORY — DX: Unspecified osteoarthritis, unspecified site: M19.90

## 2014-02-20 HISTORY — DX: Presence of cardiac pacemaker: Z95.0

## 2014-02-20 LAB — CBC WITH DIFFERENTIAL/PLATELET
BASOS PCT: 0 % (ref 0–1)
Basophils Absolute: 0 10*3/uL (ref 0.0–0.1)
EOS ABS: 0 10*3/uL (ref 0.0–0.7)
EOS PCT: 0 % (ref 0–5)
HCT: 44.4 % (ref 39.0–52.0)
Hemoglobin: 14.7 g/dL (ref 13.0–17.0)
LYMPHS ABS: 0.4 10*3/uL — AB (ref 0.7–4.0)
Lymphocytes Relative: 3 % — ABNORMAL LOW (ref 12–46)
MCH: 30.8 pg (ref 26.0–34.0)
MCHC: 33.1 g/dL (ref 30.0–36.0)
MCV: 93.1 fL (ref 78.0–100.0)
Monocytes Absolute: 1.1 10*3/uL — ABNORMAL HIGH (ref 0.1–1.0)
Monocytes Relative: 7 % (ref 3–12)
Neutro Abs: 15 10*3/uL — ABNORMAL HIGH (ref 1.7–7.7)
Neutrophils Relative %: 90 % — ABNORMAL HIGH (ref 43–77)
PLATELETS: 213 10*3/uL (ref 150–400)
RBC: 4.77 MIL/uL (ref 4.22–5.81)
RDW: 13.3 % (ref 11.5–15.5)
WBC: 16.6 10*3/uL — ABNORMAL HIGH (ref 4.0–10.5)

## 2014-02-20 LAB — COMPREHENSIVE METABOLIC PANEL
ALT: 12 U/L (ref 0–53)
ANION GAP: 14 (ref 5–15)
AST: 25 U/L (ref 0–37)
Albumin: 4.1 g/dL (ref 3.5–5.2)
Alkaline Phosphatase: 111 U/L (ref 39–117)
BUN: 25 mg/dL — AB (ref 6–23)
CALCIUM: 9.3 mg/dL (ref 8.4–10.5)
CO2: 25 mEq/L (ref 19–32)
Chloride: 107 mEq/L (ref 96–112)
Creatinine, Ser: 1.18 mg/dL (ref 0.50–1.35)
GFR calc non Af Amer: 52 mL/min — ABNORMAL LOW (ref >90)
GFR, EST AFRICAN AMERICAN: 61 mL/min — AB (ref >90)
Glucose, Bld: 162 mg/dL — ABNORMAL HIGH (ref 70–99)
Potassium: 4.8 mEq/L (ref 3.7–5.3)
SODIUM: 146 meq/L (ref 137–147)
TOTAL PROTEIN: 7.4 g/dL (ref 6.0–8.3)
Total Bilirubin: 0.5 mg/dL (ref 0.3–1.2)

## 2014-02-20 LAB — URINALYSIS, ROUTINE W REFLEX MICROSCOPIC
Bilirubin Urine: NEGATIVE
Glucose, UA: NEGATIVE mg/dL
Hgb urine dipstick: NEGATIVE
Ketones, ur: 15 mg/dL — AB
Leukocytes, UA: NEGATIVE
Nitrite: NEGATIVE
Protein, ur: NEGATIVE mg/dL
SPECIFIC GRAVITY, URINE: 1.027 (ref 1.005–1.030)
Urobilinogen, UA: 0.2 mg/dL (ref 0.0–1.0)
pH: 5 (ref 5.0–8.0)

## 2014-02-20 LAB — TSH: TSH: 5.42 u[IU]/mL — ABNORMAL HIGH (ref 0.350–4.500)

## 2014-02-20 LAB — PHOSPHORUS: Phosphorus: 3.3 mg/dL (ref 2.3–4.6)

## 2014-02-20 LAB — MAGNESIUM: Magnesium: 1.9 mg/dL (ref 1.5–2.5)

## 2014-02-20 MED ORDER — ACETAMINOPHEN 650 MG RE SUPP: 650.0000 mg | Freq: Four times a day (QID) | RECTAL | Status: DC | PRN

## 2014-02-20 MED ORDER — ONDANSETRON HCL 4 MG/2ML IJ SOLN
4.0000 mg | Freq: Once | INTRAMUSCULAR | Status: AC
  Administered 2014-02-20: 4 mg via INTRAVENOUS
  Filled 2014-02-20: qty 2

## 2014-02-20 MED ORDER — MORPHINE SULFATE 2 MG/ML IJ SOLN: 2.0000 mg | INTRAMUSCULAR | Status: DC | PRN

## 2014-02-20 MED ORDER — SODIUM CHLORIDE 0.9 % IV SOLN: INTRAVENOUS | Status: AC

## 2014-02-20 MED ORDER — SODIUM CHLORIDE 0.9 % IV SOLN
INTRAVENOUS | Status: DC
  Administered 2014-02-20 (×2): via INTRAVENOUS

## 2014-02-20 MED ORDER — ONDANSETRON HCL 4 MG/2ML IJ SOLN
4.0000 mg | Freq: Four times a day (QID) | INTRAMUSCULAR | Status: DC | PRN
  Filled 2014-02-20: qty 2

## 2014-02-20 MED ORDER — CETYLPYRIDINIUM CHLORIDE 0.05 % MT LIQD
7.0000 mL | Freq: Two times a day (BID) | OROMUCOSAL | Status: DC
  Administered 2014-02-20 – 2014-02-25 (×9): 7 mL via OROMUCOSAL

## 2014-02-20 MED ORDER — IOHEXOL 300 MG/ML  SOLN
25.0000 mL | Freq: Once | INTRAMUSCULAR | Status: AC | PRN
  Administered 2014-02-20: 25 mL via ORAL

## 2014-02-20 MED ORDER — IOHEXOL 300 MG/ML  SOLN
100.0000 mL | Freq: Once | INTRAMUSCULAR | Status: AC | PRN
  Administered 2014-02-20: 100 mL via INTRAVENOUS

## 2014-02-20 MED ORDER — METOPROLOL TARTRATE 1 MG/ML IV SOLN
5.0000 mg | Freq: Four times a day (QID) | INTRAVENOUS | Status: DC
  Administered 2014-02-20 – 2014-02-23 (×11): 5 mg via INTRAVENOUS
  Filled 2014-02-20 (×17): qty 5

## 2014-02-20 MED ORDER — IPRATROPIUM BROMIDE 0.03 % NA SOLN
2.0000 | Freq: Every day | NASAL | Status: DC
  Filled 2014-02-20: qty 30

## 2014-02-20 MED ORDER — SODIUM CHLORIDE 0.9 % IV BOLUS (SEPSIS)
500.0000 mL | Freq: Once | INTRAVENOUS | Status: AC
  Administered 2014-02-20: 500 mL via INTRAVENOUS

## 2014-02-20 MED ORDER — ENOXAPARIN SODIUM 40 MG/0.4ML ~~LOC~~ SOLN
40.0000 mg | SUBCUTANEOUS | Status: DC
Start: 2014-02-20 — End: 2014-02-25
  Administered 2014-02-20 – 2014-02-24 (×3): 40 mg via SUBCUTANEOUS
  Filled 2014-02-20 (×6): qty 0.4

## 2014-02-20 MED ORDER — ONDANSETRON HCL 4 MG PO TABS: 4.0000 mg | ORAL_TABLET | Freq: Four times a day (QID) | ORAL | Status: DC | PRN

## 2014-02-20 MED ORDER — ACETAMINOPHEN 325 MG PO TABS: 650.0000 mg | ORAL_TABLET | Freq: Four times a day (QID) | ORAL | Status: DC | PRN

## 2014-02-20 MED ORDER — IPRATROPIUM BROMIDE 0.06 % NA SOLN
1.0000 | Freq: Every day | NASAL | Status: DC
  Administered 2014-02-20 – 2014-02-24 (×5): 1 via NASAL
  Filled 2014-02-20: qty 15

## 2014-02-20 NOTE — Progress Notes (Signed)
Samuel Farias Cando Sr. [id number] Code Status: Full Admission Data: 02/20/2014 10:59 AM Attending Provider:  Ree Kida XKG:YJEHU,DJSHFW DAVIDSON, MD Consults/ Treatment Team: Treatment Team:  Md Edison Pace, MD  Samuel Running Lazenby Sr. is a 80 y.o. male patient admitted from ED awake, alert - oriented  X 3 - no acute distress noted.  VSS - Blood pressure 139/75, pulse 114, temperature 98.5 F (36.9 C), temperature source Oral, resp. rate 24, height 5\' 7"  (1.702 m), weight 71.7 kg (158 lb 1.1 oz), SpO2 94.00%.    IV in place, occlusive dsg intact without redness.  Orientation to room, and floor completed with information packet given to patient/family.  Patient declined safety video at this time.  Admission INP armband ID verified with patient/family, and in place.   SR up x 2, fall assessment complete, with patient and family able to verbalize understanding of risk associated with falls, and verbalized understanding to call nsg before up out of bed.  Call light within reach, patient able to voice, and demonstrate understanding.  Skin, clean-dry- intact without evidence of bruising, or skin tears.   No evidence of skin break down noted on exam.     Will cont to eval and treat per MD orders.  Delman Cheadle, RN 02/20/2014 10:59 AM

## 2014-02-20 NOTE — Consult Note (Signed)
[facility] Surgery Consult Note  Samuel Mealey Loncar Sr. 09/14/1921  [id number].    Requesting MD: Dr. Sherwood Gambler Chief Complaint/Reason for Consult: Abdominal pain/SBO  HPI:  80 y/o white male with extensive PMH including AFIB not anticoagulated, nonischemic cardiomyopathy with pace maker, HLD presents to [facility] with lower abdominal pain since yesterday afternoon.  Pain rated a 7-8/10, but now its a 4/10.  C/o chronic SOB and new onset anorexia, nausea and vomiting.  Denies fevers, chills, chest pain/SOB, headache, weakness, fatigue, constipation, diarrhea, or urinary issues.  No precipitating or alleviating factors.  No radiating pain.  Last BM was yesterday afternoon, no flatus today.  H/o laparoscopic converted to open right hemicolectomy with resection of the distal ileum by Jackolyn Confer for a cystic RLQ neoplasm performed on 03/11/2008.  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  He states he has had episodes like this pain since his surgery intermittently, but has not been seen or admitted to the hospital.  They usually resolve on their own.  No H/o hernias.     ROS: All systems reviewed and otherwise negative except for as above  Family History  Problem Relation Age of Onset  . Leukemia Mother   . CVA Father   . Cancer Brother   . Breast cancer Sister     Past Medical History  Diagnosis Date  . Hypertension   . Coronary artery disease   . Ventricular arrhythmia   . PAF (paroxysmal atrial fibrillation)   . Nonischemic cardiomyopathy   . CHF (congestive heart failure)   . Dyslipidemia   . DVT (deep venous thrombosis)     H/O  . PVC's (premature ventricular contractions)     Frequent    Past Surgical History  Procedure Laterality Date  . A-v cardiac pacemaker insertion  02/23/2010    [employer]tr[employer]  . Cardiac catheterization  04/21/2003    noncritical CAD  . US echocardiography  03/01/2011     pacer induced LBBB,mod. asymmetric LVH, ER 40-45%,mild AI,mild aortic root dilatation, aortic root sclerosis/ca+.  . Nm myocar perf wall motion  02/12/2010    normal    Social History:  reports that he has never smoked. He does not have any smokeless tobacco history on file. He reports that he does not drink alcohol or use illicit drugs.  Allergies: No Known Allergies   (Not in a hospital admission)  Blood pressure 125/77, pulse 101, resp. rate 21, SpO2 95.00%. Physical Exam: General: pleasant, WD/WN white male who is laying in bed in NAD HEENT: head is normocephalic, atraumatic.  Sclera are noninjected.  PERRL.  Ears and nose without any masses or lesions.  Mouth is pink and moist Heart: regular, rate, and rhythm.  No obvious murmurs, gallops, or rubs noted.  Palpable pedal pulses bilaterally Lungs: CTAB, no wheezes, rhonchi, or rales noted.  Respiratory effort nonlabored Abd: soft, distended, tender in b/l lower quadrants, diminished BS, no masses, hernias, or organomegaly, previous laparotomy scar well healed MS: all 4 extremities are symmetrical with no cyanosis, clubbing, or edema. Skin: warm and dry with no masses, lesions, or rashes Psych: A&Ox3 with an appropriate affect.   Results for orders placed during the hospital encounter of 02/20/14 (from the past 48 hour(s))  CBC WITH DIFFERENTIAL     Status: Abnormal   Collection Time    02/20/14  4:45 AM      Result Value Ref Range   WBC 16.6 (*)  4.0 - 10.5 K/uL   RBC 4.77  4.22 - 5.81 MIL/uL   Hemoglobin 14.7  13.0 - 17.0 g/dL   HCT 44.4  39.0 - 52.0 %   MCV 93.1  78.0 - 100.0 fL   MCH 30.8  26.0 - 34.0 pg   MCHC 33.1  30.0 - 36.0 g/dL   RDW 13.3  11.5 - 15.5 %   Platelets 213  150 - 400 K/uL   Neutrophils Relative % 90 (*) 43 - 77 %   Neutro Abs 15.0 (*) 1.7 - 7.7 K/uL   Lymphocytes Relative 3 (*) 12 - 46 %   Lymphs Abs 0.4 (*) 0.7 - 4.0 K/uL   Monocytes Relative 7  3 - 12 %   Monocytes Absolute 1.1 (*) 0.1 - 1.0 K/uL    Eosinophils Relative 0  0 - 5 %   Eosinophils Absolute 0.0  0.0 - 0.7 K/uL   Basophils Relative 0  0 - 1 %   Basophils Absolute 0.0  0.0 - 0.1 K/uL  COMPREHENSIVE METABOLIC PANEL     Status: Abnormal   Collection Time    02/20/14  4:45 AM      Result Value Ref Range   Sodium 146  137 - 147 mEq/L   Potassium 4.8  3.7 - 5.3 mEq/L   Chloride 107  96 - 112 mEq/L   CO2 25  19 - 32 mEq/L   Glucose, Bld 162 (*) 70 - 99 mg/dL   BUN 25 (*) 6 - 23 mg/dL   Creatinine, Ser 1.18  0.50 - 1.35 mg/dL   Calcium 9.3  8.4 - 10.5 mg/dL   Total Protein 7.4  6.0 - 8.3 g/dL   Albumin 4.1  3.5 - 5.2 g/dL   AST 25  0 - 37 U/L   ALT 12  0 - 53 U/L   Alkaline Phosphatase 111  39 - 117 U/L   Total Bilirubin 0.5  0.3 - 1.2 mg/dL   GFR calc non Af Amer 52 (*) >90 mL/min   GFR calc Af Amer 61 (*) >90 mL/min   Comment: (NOTE)     The eGFR has been calculated using the CKD EPI equation.     This calculation has not been validated in all clinical situations.     eGFR's persistently <90 mL/min signify possible Chronic Kidney     Disease.   Anion gap 14  5 - 15  URINALYSIS, ROUTINE W REFLEX MICROSCOPIC     Status: Abnormal   Collection Time    02/20/14  7:45 AM      Result Value Ref Range   Color, Urine YELLOW  YELLOW   APPearance CLEAR  CLEAR   Specific Gravity, Urine 1.027  1.005 - 1.030   pH 5.0  5.0 - 8.0   Glucose, UA NEGATIVE  NEGATIVE mg/dL   Hgb urine dipstick NEGATIVE  NEGATIVE   Bilirubin Urine NEGATIVE  NEGATIVE   Ketones, ur 15 (*) NEGATIVE mg/dL   Protein, ur NEGATIVE  NEGATIVE mg/dL   Urobilinogen, UA 0.2  0.0 - 1.0 mg/dL   Nitrite NEGATIVE  NEGATIVE   Leukocytes, UA NEGATIVE  NEGATIVE   Comment: MICROSCOPIC NOT DONE ON URINES WITH NEGATIVE PROTEIN, BLOOD, LEUKOCYTES, NITRITE, OR GLUCOSE <1000 mg/dL.   Ct Abdomen Pelvis W Contrast  02/20/2014   CLINICAL DATA:  Left lower quadrant pain beginning last night.  EXAM: CT ABDOMEN AND PELVIS WITH CONTRAST  TECHNIQUE: Multidetector CT imaging  of the abdomen and  pelvis was performed using the standard protocol following bolus administration of intravenous contrast.  CONTRAST:  100 mL OMNIPAQUE IOHEXOL 300 MG/ML  SOLN  COMPARISON:  CT abdomen and pelvis 03/08/2008.  FINDINGS: A 0.3 cm subpleural nodule in the right lung base on image 5 is unchanged. Dependent atelectasis is noted. There is no pleural or pericardial effusion. Pacing leads are noted.  Small bowel loops are mildly dilated at 3.1 cm with air fluid levels present. The stomach is distended. A transition point is identified in the right lower quadrant of the abdomen where distal ileal loops are completely decompressed. Bowel contents just proximal to this transition point contain fecal type material. The colon is nearly completely decompressed. Surgical anastomosis in the colon is seen at the hepatic flexure with the patient is status post subtotal right colectomy. Obstruction is not related to the surgical anastomosis. There is a small volume of abdominal and pelvic ascites.  A 0.3 cm hypo attenuating lesion in the dome the liver on image 13 is identified. The liver is otherwise unremarkable. Bilateral renal cysts are unchanged. The adrenal glands, spleen, pancreas and gallbladder are unremarkable. No lymphadenopathy is seen. There is no focal fluid collection. No lytic or sclerotic bony lesion is seen.  IMPRESSION: Study is positive for small bowel obstruction. The obstruction is likely due to adhesions with a transition point identified in the right lower quadrant. No mass is seen.  Small volume of abdominal and pelvic ascites likely related to obstruction.  0.3 cm in diameter hypoattenuating lesion in the dome the liver appears new since the prior study. It cannot be definitively characterized but likely represents a cyst. Recommend attention on follow-up examinations.  Status post subtotal right hemicolectomy.   Electronically Signed   By: Inge Rise M.D.   On: 02/20/2014 07:53       Assessment/Plan pSBO Abdominal pain N/V  Leukocytosis - 16.6  Plan: 1.  Admit to Medicine, we will consult 2.  NG tube on LIWS, NPO, bowel rest, IVF, pain control, antiemetics 3.  SCD's and DVT proph per medicine 4.  Ambulate and IS 5.  Hopefully he will resolve with conservative management alone.  He has already put out nearly 1L of stomach contents out of his NG and he's feeling much better.    6.  Repeat labs in AM, monitor NG output  Coralie Keens, [facility] Surgery 02/20/2014, 8:56 AM Pager: [phone]

## 2014-02-20 NOTE — ED Notes (Signed)
Patient transported to CT 

## 2014-02-20 NOTE — ED Provider Notes (Signed)
CSN: [id number]     Arrival date & time 02/20/14  0259 History   First MD Initiated Contact with Patient 02/20/14 0425     Chief Complaint  Patient presents with  . Abdominal Pain     (Consider location/radiation/quality/duration/timing/severity/associated sxs/prior Treatment) HPI Comments: Patient is a 80 year old male with extensive past medical history. He presents with complaints of lower abdominal pain that started approximately 12 hours ago. The discomfort is across his lower abdomen. He denies any fevers or chills. He denies constipation or diarrhea. He did have prior surgery to remove the mass from his colon several years ago and states that his presentation was similar to this.  Patient is a 80 y.o. male presenting with abdominal pain. The history is provided by the patient.  Abdominal Pain Pain location:  RLQ and LLQ Pain quality: sharp   Pain radiates to:  Does not radiate Pain severity:  Moderate Onset quality:  Gradual Duration:  12 hours Timing:  Constant Progression:  Worsening Chronicity:  New Relieved by:  Nothing Exacerbated by: Movement and palpation. Ineffective treatments:  None tried Associated symptoms: no chills, no fever, no nausea and no vomiting     Past Medical History  Diagnosis Date  . Hypertension   . Coronary artery disease   . Ventricular arrhythmia   . PAF (paroxysmal atrial fibrillation)   . Nonischemic cardiomyopathy   . CHF (congestive heart failure)   . Dyslipidemia   . DVT (deep venous thrombosis)     H/O  . PVC's (premature ventricular contractions)     Frequent   Past Surgical History  Procedure Laterality Date  . A-v cardiac pacemaker insertion  02/23/2010    [employer]tr[employer]  . Cardiac catheterization  04/21/2003    noncritical CAD  . US echocardiography  03/01/2011    pacer induced LBBB,mod. asymmetric LVH, ER 40-45%,mild AI,mild aortic root dilatation, aortic root sclerosis/ca+.  . Nm myocar perf wall motion  02/12/2010    normal    Family History  Problem Relation Age of Onset  . Leukemia Mother   . CVA Father   . Cancer Brother   . Breast cancer Sister    History  Substance Use Topics  . Smoking status: Never Smoker   . Smokeless tobacco: Not on file  . Alcohol Use: No    Review of Systems  Constitutional: Negative for fever and chills.  Gastrointestinal: Positive for abdominal pain. Negative for nausea and vomiting.  All other systems reviewed and are negative.     Allergies  Review of patient's allergies indicates no known allergies.  Home Medications   Prior to Admission medications   Medication Sig Start Date End Date Taking? Authorizing Provider  Calcium-Vitamin D (CALTRATE 600 PLUS-VIT D PO) Take 600 mg by mouth 2 (two) times daily.   Yes Historical Provider, MD  ipratropium (ATROVENT) 0.03 % nasal spray Place 2 sprays into the nose daily. 09/11/13  Yes Historical Provider, MD  metoprolol succinate (TOPROL-XL) 50 MG 24 hr tablet Take 50 mg by mouth daily. Take with or immediately following a meal.   Yes Historical Provider, MD  polyethylene glycol powder (GLYCOLAX/MIRALAX) powder Take 17 g by mouth as needed for mild constipation.  05/08/12  Yes John-Adam Bonk, MD  rosuvastatin (CRESTOR) 20 MG tablet Take 20 mg by mouth daily.   Yes Historical Provider, MD   BP 124/69  Pulse 88  Resp 16  SpO2 98% Physical Exam  Nursing note and vitals reviewed. Constitutional: He is oriented to person,  place, and time. He appears well-developed and well-nourished. No distress.  HENT:  Head: Normocephalic and atraumatic.  Mouth/Throat: Oropharynx is clear and moist.  Neck: Normal range of motion. Neck supple.  Cardiovascular: Normal rate, regular rhythm and normal heart sounds.   No murmur heard. Pulmonary/Chest: Effort normal and breath sounds normal. No respiratory distress. He has no wheezes.  Abdominal: Soft. Bowel sounds are normal. He exhibits no distension. There is tenderness.  There is  tenderness to palpation in the right lower quadrant, left lower quadrant, and suprapubic region. There is no rebound and no guarding.  Musculoskeletal: Normal range of motion. He exhibits no edema.  Neurological: He is alert and oriented to person, place, and time.  Skin: Skin is warm and dry. He is not diaphoretic.    ED Course  Procedures (including critical care time) Labs Review Labs Reviewed - No data to display  Imaging Review No results found.   EKG Interpretation None      MDM   Final diagnoses:  None    Patient presents with lower abdominal pain and elevated white count. A CT scan will be obtained to further investigate the etiology of this. Care will be signed out at shift change to Dr. Regenia Skeeter who will obtain the results of the CT scan and determine the final disposition.    Veryl Speak, MD 02/21/14 [phone]

## 2014-02-20 NOTE — Consult Note (Signed)
Agree with above, feels better with ng, has been passing some flatus, will treat conservatively and hopefully will resolve.

## 2014-02-20 NOTE — H&P (Signed)
[facility] History and Physical  Samuel Mann NKN:[id number] DOB: August 24, 1921 DOA: 02/20/2014  Referring physician:  PCP: Horatio Pel, MD  Specialists:   Chief Complaint: Abdominal pain,  Nausea, vomiting  HPI: Samuel Running Eisner Sr. is a 80 y.o. male  With a past medical history of paroxysmal atrial fibrillation, not on anticoagulation, nonischemic cardiomyopathy, hypertension, hyperlipidemia, that presented to the emergency department with complaints of abdominal pain, nausea, vomiting that began yesterday. Patient states that he has had abdominal surgery in the past and that he had small bowel obstruction. He states he usually will subside on their own. Patient symptoms did not subside overnight, therefore he decided to come to emergency department. He denies any fever, chills, recent travel, ill contacts. He denies any chest pain, shortness of breath.  He has had sharp abdominal pain in the left lower quadrant as well as suprapubic regions. Nothing makes his pain better or worse. Patient denies any dysuria or problems urinating.  Patient saw his cardiologist approximately 8 months ago, and had his pacemaker interrogated. Patient was told that he has not been in atrial fibrillation in the past 2 years.  In the emergency department, patient did have a fever. Patient was completely asymptomatic. CT scan of the abdomen and pelvis showed small bowel obstruction. [facility] was called for admission.  Review of Systems:  Constitutional: Denies fever, chills, diaphoresis, appetite change and fatigue.  HEENT: Denies photophobia, eye pain, redness, hearing loss, ear pain, congestion, sore throat, rhinorrhea, sneezing, mouth sores, trouble swallowing, neck pain, neck stiffness and tinnitus.   Respiratory: Denies SOB, DOE, cough, chest tightness,  and wheezing.   Cardiovascular: Denies chest pain, palpitations and leg swelling.  Gastrointestinal: Complains of Abdomen pain, nausea, vomiting.    Genitourinary: Denies dysuria, urgency, frequency, hematuria, flank pain and difficulty urinating.  Musculoskeletal: Denies myalgias, back pain, joint swelling, arthralgias and gait problem.  Skin: Denies pallor, rash and wound.  Neurological: Denies dizziness, seizures, syncope, weakness, light-headedness, numbness and headaches.  Hematological: Denies adenopathy. Easy bruising, personal or family bleeding history  Psychiatric/Behavioral: Denies suicidal ideation, mood changes, confusion, nervousness, sleep disturbance and agitation  Past Medical History  Diagnosis Date  . Hypertension   . Coronary artery disease   . Ventricular arrhythmia   . PAF (paroxysmal atrial fibrillation)   . Nonischemic cardiomyopathy   . CHF (congestive heart failure)   . Dyslipidemia   . DVT (deep venous thrombosis)     H/O  . PVC's (premature ventricular contractions)     Frequent   Past Surgical History  Procedure Laterality Date  . A-v cardiac pacemaker insertion  02/23/2010    [employer]  . Cardiac catheterization  04/21/2003    noncritical CAD  . US echocardiography  03/01/2011    pacer induced LBBB,mod. asymmetric LVH, ER 40-45%,mild AI,mild aortic root dilatation, aortic root sclerosis/ca+.  . Nm myocar perf wall motion  02/12/2010    normal   Social History:  reports that he has never smoked. He does not have any smokeless tobacco history on file. He reports that he does not drink alcohol or use illicit drugs. Lives at home with his wife.  No Known Allergies  Family History  Problem Relation Age of Onset  . Leukemia Mother   . CVA Father   . Cancer Brother   . Breast cancer Sister      Prior to Admission medications   Medication Sig Start Date End Date Taking? Authorizing Provider  Calcium-Vitamin D (CALTRATE 600 PLUS-VIT D PO)  Take 600 mg by mouth 2 (two) times daily.   Yes Historical Provider, MD  ipratropium (ATROVENT) 0.03 % nasal spray Place 2 sprays into the nose daily. 09/11/13   Yes Historical Provider, MD  metoprolol succinate (TOPROL-XL) 50 MG 24 hr tablet Take 50 mg by mouth daily. Take with or immediately following a meal.   Yes Historical Provider, MD  polyethylene glycol powder (GLYCOLAX/MIRALAX) powder Take 17 g by mouth as needed for mild constipation.  05/08/12  Yes John-Adam Bonk, MD  rosuvastatin (CRESTOR) 20 MG tablet Take 20 mg by mouth daily.   Yes Historical Provider, MD   Physical Exam: Filed Vitals:   02/20/14 0645  BP: 125/77  Pulse: 101  Resp: 21     General: Well developed, well nourished, NAD, appears stated age  HEENT: NCAT, PERRLA, EOMI, Anicteic Sclera, mucous membranes moist. NG tube in place  Neck: Supple, no JVD, no masses  Cardiovascular: S1 S2 auscultated, no rubs, murmurs or gallops. Tachycardic  Respiratory: Clear to auscultation bilaterally with equal chest rise  Abdomen: Soft, Suprapubic and LLQ tenderness, nondistended, + bowel sounds  Extremities: warm dry without cyanosis clubbing or edema  Neuro: AAOx3, cranial nerves grossly intact. Strength 5/5 in patient's upper and lower extremities bilaterally  Skin: Without rashes exudates or nodules  Psych: Normal affect and demeanor with intact judgement and insight  Labs on Admission:  Basic Metabolic Panel:  Recent Labs Lab 02/20/14 0445  NA 146  K 4.8  CL 107  CO2 25  GLUCOSE 162*  BUN 25*  CREATININE 1.18  CALCIUM 9.3   Liver Function Tests:  Recent Labs Lab 02/20/14 0445  AST 25  ALT 12  ALKPHOS 111  BILITOT 0.5  PROT 7.4  ALBUMIN 4.1   No results found for this basename: LIPASE, AMYLASE,  in the last 168 hours No results found for this basename: AMMONIA,  in the last 168 hours CBC:  Recent Labs Lab 02/20/14 0445  WBC 16.6*  NEUTROABS 15.0*  HGB 14.7  HCT 44.4  MCV 93.1  PLT 213   Cardiac Enzymes: No results found for this basename: CKTOTAL, CKMB, CKMBINDEX, TROPONINI,  in the last 168 hours  BNP (last 3 results) No results found  for this basename: PROBNP,  in the last 8760 hours CBG: No results found for this basename: GLUCAP,  in the last 168 hours  Radiological Exams on Admission: Ct Abdomen Pelvis W Contrast  02/20/2014   CLINICAL DATA:  Left lower quadrant pain beginning last night.  EXAM: CT ABDOMEN AND PELVIS WITH CONTRAST  TECHNIQUE: Multidetector CT imaging of the abdomen and pelvis was performed using the standard protocol following bolus administration of intravenous contrast.  CONTRAST:  100 mL OMNIPAQUE IOHEXOL 300 MG/ML  SOLN  COMPARISON:  CT abdomen and pelvis 03/08/2008.  FINDINGS: A 0.3 cm subpleural nodule in the right lung base on image 5 is unchanged. Dependent atelectasis is noted. There is no pleural or pericardial effusion. Pacing leads are noted.  Small bowel loops are mildly dilated at 3.1 cm with air fluid levels present. The stomach is distended. A transition point is identified in the right lower quadrant of the abdomen where distal ileal loops are completely decompressed. Bowel contents just proximal to this transition point contain fecal type material. The colon is nearly completely decompressed. Surgical anastomosis in the colon is seen at the hepatic flexure with the patient is status post subtotal right colectomy. Obstruction is not related to the surgical anastomosis. There is a  small volume of abdominal and pelvic ascites.  A 0.3 cm hypo attenuating lesion in the dome the liver on image 13 is identified. The liver is otherwise unremarkable. Bilateral renal cysts are unchanged. The adrenal glands, spleen, pancreas and gallbladder are unremarkable. No lymphadenopathy is seen. There is no focal fluid collection. No lytic or sclerotic bony lesion is seen.  IMPRESSION: Study is positive for small bowel obstruction. The obstruction is likely due to adhesions with a transition point identified in the right lower quadrant. No mass is seen.  Small volume of abdominal and pelvic ascites likely related to  obstruction.  0.3 cm in diameter hypoattenuating lesion in the dome the liver appears new since the prior study. It cannot be definitively characterized but likely represents a cyst. Recommend attention on follow-up examinations.  Status post subtotal right hemicolectomy.   Electronically Signed   By: Inge Rise M.D.   On: 02/20/2014 07:53    EKG: None  Assessment/Plan  Small bowel obstruction likely secondary to adhesion -Patient will be admitted to telemetry -NG tube has been placed. -Patient was made n.p.o., will place him on antiemetics as needed, pain control as needed -Will place patient on IV -General surgery has been consulted  Abdominal pain with Nausea and vomiting -Secondary small bowel obstruction -The treatment plan as above  Paroxysmal atrial fibrillation -According to the patient, his pacemaker was interrogated patient has not been in atrial fibrillation for the past 2 years. -Monitor currently shows sinus rhythm -Will continue metoprolol however will use IV form  History of nonischemic cardiomyopathy with CHF -No documented echocardiogram system. -Unknown if systolic or diastolic CHF -Will monitor patient's daily weights as well as intake and output  Hyperlipidemia -Will hold statin until SBO resolves  Nonsustained VTach/Sinus tachycardia -Likely secondary to above -Patient had a 8 beat run of VTach in the ER -Will obtain TSH level, Magnesium and phos levels and continue metoprolol -Patient will placed on telemetry  Leukocytosis -Likely reactive to above -Will continue to monitor CBC  DVT prophylaxis: Lovenox  Code Status: Full  Condition: Guarded  Family Communication: Friend at bedside. Admission, patients condition and plan of care including tests being ordered have been discussed with the patient and friends who indicate understanding and agree with the plan and Code Status.  Disposition Plan: Admitted  Time spent: 60 minutes  Andi Layfield,  Stefen Juba D.O. [facility] Pager [phone]  If 7PM-7AM, please contact night-coverage [email or web address] Password [facility] 02/20/2014, 9:31 AM

## 2014-02-20 NOTE — ED Provider Notes (Addendum)
8:12 AM patient's CT scan is consistent with a small bowel obstruction. Patient states he had vomiting earlier but denies any current nausea. Pain is significantly improved. Given his age and comorbidities he would need admission for IV fluid hydration and bowel rest. Given no nausea or vomiting now I do not feel he needs an NG tube. Given his past surgeries will consult surgery.  8:38 AM Due to comorbidities surgery would like medicine to admit. He also vomited a few minutes ago. Due to this, surgery recommends NG tube.  Ephraim Hamburger, MD 02/20/14 0900  9:42 AM Patient had an asymptomatic run of Vtach (8 beats). Normal rate now. D/w hospitalist, will place on tele now  Ephraim Hamburger, MD 02/20/14 [phone]

## 2014-02-20 NOTE — ED Notes (Signed)
Pt. Had a run of V-tach recorded the arrthymia  And gave to Dr. Regenia Skeeter.  Pt. Denies any chest pain denies any sob Pt. Stated, "I did not feel anything."

## 2014-02-20 NOTE — ED Notes (Signed)
Pt. Drinking oral contrast.

## 2014-02-20 NOTE — ED Notes (Addendum)
Pt. Vomited a moderate amount of foul liquid emesis.

## 2014-02-20 NOTE — Progress Notes (Signed)
Utilization review completed.  

## 2014-02-20 NOTE — ED Notes (Signed)
Pt to ED from home c/o LLQ pain since last night. Reports hx of similar pain in RLQ and "was told it was a mass." Abd nontender on palpation. Denies urinary symptoms. Last BM yesterdat

## 2014-02-20 NOTE — Progress Notes (Signed)
Patient ambulated in hall with RN using walker. Following ambulation patient passing flatus and made small bowel movement. Pt tolerated ambulation well. Continues to have output in NG tube, but rate is slowing.

## 2014-02-20 NOTE — Progress Notes (Signed)
Report received from ED RN for patient's admission to [phone]

## 2014-02-21 ENCOUNTER — Inpatient Hospital Stay (HOSPITAL_COMMUNITY): Payer: Medicare Other

## 2014-02-21 DIAGNOSIS — I4891 Unspecified atrial fibrillation: Secondary | ICD-10-CM

## 2014-02-21 LAB — CBC
HCT: 38 % — ABNORMAL LOW (ref 39.0–52.0)
HEMOGLOBIN: 12.3 g/dL — AB (ref 13.0–17.0)
MCH: 30.7 pg (ref 26.0–34.0)
MCHC: 32.4 g/dL (ref 30.0–36.0)
MCV: 94.8 fL (ref 78.0–100.0)
Platelets: 165 10*3/uL (ref 150–400)
RBC: 4.01 MIL/uL — ABNORMAL LOW (ref 4.22–5.81)
RDW: 13.8 % (ref 11.5–15.5)
WBC: 9.2 10*3/uL (ref 4.0–10.5)

## 2014-02-21 LAB — COMPREHENSIVE METABOLIC PANEL
ALT: 11 U/L (ref 0–53)
AST: 26 U/L (ref 0–37)
Albumin: 2.9 g/dL — ABNORMAL LOW (ref 3.5–5.2)
Alkaline Phosphatase: 71 U/L (ref 39–117)
Anion gap: 12 (ref 5–15)
BUN: 38 mg/dL — ABNORMAL HIGH (ref 6–23)
CO2: 25 meq/L (ref 19–32)
Calcium: 8.1 mg/dL — ABNORMAL LOW (ref 8.4–10.5)
Chloride: 110 mEq/L (ref 96–112)
Creatinine, Ser: 1.57 mg/dL — ABNORMAL HIGH (ref 0.50–1.35)
GFR calc non Af Amer: 37 mL/min — ABNORMAL LOW (ref >90)
GFR, EST AFRICAN AMERICAN: 43 mL/min — AB (ref >90)
GLUCOSE: 92 mg/dL (ref 70–99)
Potassium: 4.9 mEq/L (ref 3.7–5.3)
SODIUM: 147 meq/L (ref 137–147)
Total Bilirubin: 0.5 mg/dL (ref 0.3–1.2)
Total Protein: 5.6 g/dL — ABNORMAL LOW (ref 6.0–8.3)

## 2014-02-21 LAB — MAGNESIUM: Magnesium: 1.8 mg/dL (ref 1.5–2.5)

## 2014-02-21 MED ORDER — SODIUM CHLORIDE 0.45 % IV SOLN
INTRAVENOUS | Status: DC
  Administered 2014-02-21 – 2014-02-23 (×4): via INTRAVENOUS

## 2014-02-21 NOTE — Progress Notes (Signed)
PATIENT DETAILS Name: Samuel Masoner Melaragno Sr. Age: 80 y.o. Sex: male Date of Birth: Nov 16, 1921 Admit Date: 02/20/2014 Admitting Physician Cristal Ford, DO SEG:BTDVV,OHYWVP DAVIDSON, MD  Subjective: Doing well this morning. Pain across lower abdomen has decreased. Ambulating well, had one small BM yesterday. Passing gas.   Assessment/Plan: Small bowel obstruction likely secondary to adhesion  -Patient was admitted, kept n.p.o., started on IV fluids and NG tube was placed. General surgery was consulted.  - Passing flatus, and had a small bowel movement on 8/13. Await general surgery followup, but suspect NG tube can be discontinued, and started on clear liquid diet. Abdomen is very soft and exam without any distention or tenderness.   ARF -likely pre-renal-however did contrast for his CT Abd on 8/13. UA shows no proteinuria -continue with IVF, will change to 0.45 NS, recheck lytes in am  Hypernatremia -change IVF to 0.45 NS. Recheck lytes in am  Paroxysmal atrial fibrillation  - Rate controlled, continue with IV Lopressor for now, switched back to oral metoprolol when able. - Previously was on Coumadin, however per patient -his PCP after discussion with cardiologist discontinued approximately 2 months back. Currently will not plan on restarting at this point  History of nonischemic cardiomyopathy with CHF  -No documented echocardiogram system.  -Unknown if systolic or diastolic CHF -Echo pending  Hyperlipidemia  -Will hold statin until SBO resolves   Nonsustained VTach/Sinus tachycardia  -Likely secondary to above  -Patient had a 8 beat run of VTach in the ER-none since -Normal Mag and Phosporus levels; elevated TSH not a cause: continue metoprolol  -Patient will placed on telemetry   Leukocytosis  -Likely reactive to above  -WBC decreased to normal level -Will continue to monitor CBC  Disposition: Remain inpatient  DVT Prophylaxis: Lovenox   Code Status: Full  code   Family Communication Spouse at bedside  Procedures:  None  CONSULTS:  general surgery  Time spent 40 minutes-which includes 50% of the time with face-to-face with patient/ family and coordinating care related to the above assessment and plan.   MEDICATIONS: Scheduled Meds: . antiseptic oral rinse  7 mL Mouth Rinse BID  . enoxaparin (LOVENOX) injection  40 mg Subcutaneous Q24H  . ipratropium  1 spray Each Nare Daily  . metoprolol  5 mg Intravenous 4 times per day   Continuous Infusions: . sodium chloride 75 mL/hr at 02/21/14 0747   PRN Meds:.acetaminophen, acetaminophen, morphine injection, ondansetron (ZOFRAN) IV, ondansetron  Antibiotics: Anti-infectives   None       PHYSICAL EXAM: Vital signs in last 24 hours: Filed Vitals:   02/20/14 2045 02/21/14 0046 02/21/14 0155 02/21/14 0436  BP: 100/54 110/62 111/51 122/69  Pulse: 69 70 72 63  Temp: 98.9 F (37.2 C) 98.3 F (36.8 C)  98.4 F (36.9 C)  TempSrc: Oral Oral  Oral  Resp: 18 18 18 18   Height:      Weight:    72 kg (158 lb 11.7 oz)  SpO2: 95% 94% 92% 93%    Weight change:  Filed Weights   02/20/14 1038 02/21/14 0436  Weight: 71.7 kg (158 lb 1.1 oz) 72 kg (158 lb 11.7 oz)   Body mass index is 24.85 kg/(m^2).   Gen Exam: Awake and alert with clear speech.   Neck: Supple, No JVD.   Chest: B/L Clear.   CVS: S1 S2 Regular, no murmurs.  Abdomen: soft, BS +, mildly tender to palpation of lower abdomen, pain decreasing,  non distended.  Extremities: no edema, lower extremities warm to touch. Neurologic: Non Focal.   Skin: No Rash.   Wounds: N/A.    Intake/Output from previous day:  Intake/Output Summary (Last 24 hours) at 02/21/14 0954 Last data filed at 02/20/14 2030  Gross per 24 hour  Intake    615 ml  Output   1175 ml  Net   -560 ml     LAB RESULTS: CBC  Recent Labs Lab 02/20/14 0445 02/21/14 0516  WBC 16.6* 9.2  HGB 14.7 12.3*  HCT 44.4 38.0*  PLT 213 165  MCV 93.1 94.8    MCH 30.8 30.7  MCHC 33.1 32.4  RDW 13.3 13.8  LYMPHSABS 0.4*  --   MONOABS 1.1*  --   EOSABS 0.0  --   BASOSABS 0.0  --     Chemistries   Recent Labs Lab 02/20/14 0445 02/21/14 0516  NA 146 147  K 4.8 4.9  CL 107 110  CO2 25 25  GLUCOSE 162* 92  BUN 25* 38*  CREATININE 1.18 1.57*  CALCIUM 9.3 8.1*  MG 1.9  --     No components found with this basename: POCBNP,  No results found for this basename: DDIMER,  in the last 72 hours No results found for this basename: HGBA1C,  in the last 72 hours No results found for this basename: CHOL, HDL, LDLCALC, TRIG, CHOLHDL, LDLDIRECT,  in the last 72 hours  Recent Labs  02/20/14 0445  TSH 5.420*    RADIOLOGY STUDIES/RESULTS: Ct Abdomen Pelvis W Contrast  02/20/2014   CLINICAL DATA:  Left lower quadrant pain beginning last night.  EXAM: CT ABDOMEN AND PELVIS WITH CONTRAST  TECHNIQUE: Multidetector CT imaging of the abdomen and pelvis was performed using the standard protocol following bolus administration of intravenous contrast.  CONTRAST:  100 mL OMNIPAQUE IOHEXOL 300 MG/ML  SOLN  COMPARISON:  CT abdomen and pelvis 03/08/2008.  FINDINGS: A 0.3 cm subpleural nodule in the right lung base on image 5 is unchanged. Dependent atelectasis is noted. There is no pleural or pericardial effusion. Pacing leads are noted.  Small bowel loops are mildly dilated at 3.1 cm with air fluid levels present. The stomach is distended. A transition point is identified in the right lower quadrant of the abdomen where distal ileal loops are completely decompressed. Bowel contents just proximal to this transition point contain fecal type material. The colon is nearly completely decompressed. Surgical anastomosis in the colon is seen at the hepatic flexure with the patient is status post subtotal right colectomy. Obstruction is not related to the surgical anastomosis. There is a small volume of abdominal and pelvic ascites.  A 0.3 cm hypo attenuating lesion in the  dome the liver on image 13 is identified. The liver is otherwise unremarkable. Bilateral renal cysts are unchanged. The adrenal glands, spleen, pancreas and gallbladder are unremarkable. No lymphadenopathy is seen. There is no focal fluid collection. No lytic or sclerotic bony lesion is seen.  IMPRESSION: Study is positive for small bowel obstruction. The obstruction is likely due to adhesions with a transition point identified in the right lower quadrant. No mass is seen.  Small volume of abdominal and pelvic ascites likely related to obstruction.  0.3 cm in diameter hypoattenuating lesion in the dome the liver appears new since the prior study. It cannot be definitively characterized but likely represents a cyst. Recommend attention on follow-up examinations.  Status post subtotal right hemicolectomy.   Electronically Signed   By:  Inge Rise M.D.   On: 02/20/2014 07:53   Dg Abd 2 Views  02/21/2014   CLINICAL DATA:  80 year old male with small bowel obstruction and enteric tube placement. Initial encounter.  EXAM: ABDOMEN - 2 VIEW  COMPARISON:  Radiographs and CT Abdomen and Pelvis 02/20/2014 and earlier.  FINDINGS: Upright and supine radiographs.  Enteric tube in place, tip at the gastric antrum. Side hole at the gastric body.  Possible small right pleural effusion. No pneumoperitoneum on today upright images.  Gas-filled small bowel loops measuring up to 3 cm diameter. Small volume oral contrast now suspected in the left colon. Colonic gas has mildly increased since the most recent radiographs. Staple lines re - identified in the right abdomen. Small volume excreted IV contrast in the bladder. No acute osseous abnormality identified.  IMPRESSION: 1. Similar bowel gas pattern since the recent CT Abdomen and Pelvis, compatible with partial small bowel obstruction. No free air. 2. Enteric tube in place, side hole the gastric body. 3. Suggestion of small right pleural effusion.   Electronically Signed   By:  Lars Pinks M.D.   On: 02/21/2014 08:08   Dg Abd Portable 1v  02/20/2014   CLINICAL DATA:  NG tube placement  EXAM: PORTABLE ABDOMEN - 1 VIEW  COMPARISON:  None.  FINDINGS: There is nonspecific nonobstructive bowel gas pattern. NG tube with tip in proximal duodenum.  IMPRESSION: NG tube in place.   Electronically Signed   By: Lahoma Crocker M.D.   On: 02/20/2014 12:42    [employer]   [facility] Pager:[phone]  If 7PM-7AM, please contact night-coverage [email or web address] Password [email or web address] 02/21/2014, 9:54 AM   LOS: 1 day   **Disclaimer: This note may have been dictated with voice recognition software. Similar sounding words can inadvertently be transcribed and this note may contain transcription errors which may not have been corrected upon publication of note.**  Attending Patient was seen, examined,treatment plan was discussed with the Physician extender. I have directly reviewed the clinical findings, lab, imaging studies and management of this patient in detail. I have made the necessary changes to the above noted documentation, and agree with the documentation, as recorded by the Physician extender.  Nena Alexander MD [facility].

## 2014-02-21 NOTE — Progress Notes (Addendum)
Came to pt's room to find that NG tube had partially slipped from nose. Reinserted NG tube. Paged provider on call. Awaiting further orders. Will continue to monitor pt. NG tube was reinserted, placement was checked, & tape was changed. Pt in no apparent distress at this time. No further orders given from provider K. Schorr.

## 2014-02-21 NOTE — Care Management Note (Signed)
    Page 1 of 1   02/25/2014     10:57:42 AM CARE MANAGEMENT NOTE 02/25/2014  Patient:  Samuel Mann, Samuel Mann   Account Number:  [id number]  Date Initiated:  02/21/2014  Documentation initiated by:  Tomi Bamberger  Subjective/Objective Assessment:   dx sbo  admit- lives with spouse.     Action/Plan:   Anticipated DC Date:  02/25/2014   Anticipated DC Plan:  [place]  CM consult      Choice offered to / List presented to:             Status of service:  Completed, signed off Medicare Important Message given?  YES (If response is "NO", the following Medicare IM given date fields will be blank) Date Medicare IM given:  02/24/2014 Medicare IM given by:  Tomi Bamberger Date Additional Medicare IM given:   Additional Medicare IM given by:    Discharge Disposition:  HOME/SELF CARE  Per UR Regulation:  Reviewed for med. necessity/level of care/duration of stay  If discussed at [place] of Stay Meetings, dates discussed:    Comments:  02/25/14 [place], BSN [phone] patient is for dc today, no needs anticipated.  02/21/14 [place], BSN [phone] patient lives with spouse, npo, ng tube.  NCM will continue to follow for dc needs.

## 2014-02-21 NOTE — Progress Notes (Addendum)
Just received pt's 0000 dose of lopressor from pharmacy. Per provider on call K. Schorr, ok to hold pt's lopressor for bp of 111/51. Will continue to monitor pt and reassess for 0600 dose

## 2014-02-21 NOTE — Progress Notes (Signed)
  Echocardiogram 2D Echocardiogram has been performed.  Mauricio Po 02/21/2014, 3:12 PM

## 2014-02-22 ENCOUNTER — Inpatient Hospital Stay (HOSPITAL_COMMUNITY): Payer: Medicare Other

## 2014-02-22 LAB — BASIC METABOLIC PANEL
ANION GAP: 16 — AB (ref 5–15)
BUN: 33 mg/dL — AB (ref 6–23)
CHLORIDE: 108 meq/L (ref 96–112)
CO2: 21 meq/L (ref 19–32)
Calcium: 8.5 mg/dL (ref 8.4–10.5)
Creatinine, Ser: 1.31 mg/dL (ref 0.50–1.35)
GFR calc Af Amer: 54 mL/min — ABNORMAL LOW (ref >90)
GFR calc non Af Amer: 46 mL/min — ABNORMAL LOW (ref >90)
Glucose, Bld: 63 mg/dL — ABNORMAL LOW (ref 70–99)
Potassium: 4.6 mEq/L (ref 3.7–5.3)
Sodium: 145 mEq/L (ref 137–147)

## 2014-02-22 LAB — CBC
HCT: 36.2 % — ABNORMAL LOW (ref 39.0–52.0)
Hemoglobin: 11.7 g/dL — ABNORMAL LOW (ref 13.0–17.0)
MCH: 30.5 pg (ref 26.0–34.0)
MCHC: 32.3 g/dL (ref 30.0–36.0)
MCV: 94.3 fL (ref 78.0–100.0)
Platelets: 168 10*3/uL (ref 150–400)
RBC: 3.84 MIL/uL — AB (ref 4.22–5.81)
RDW: 13.8 % (ref 11.5–15.5)
WBC: 10.4 10*3/uL (ref 4.0–10.5)

## 2014-02-22 MED ORDER — METOPROLOL TARTRATE 1 MG/ML IV SOLN
5.0000 mg | Freq: Once | INTRAVENOUS | Status: AC
  Administered 2014-02-22: 5 mg via INTRAVENOUS

## 2014-02-22 MED ORDER — MAGNESIUM SULFATE 40 MG/ML IJ SOLN
2.0000 g | Freq: Once | INTRAMUSCULAR | Status: AC
  Administered 2014-02-22: 2 g via INTRAVENOUS
  Filled 2014-02-22: qty 50

## 2014-02-22 NOTE — Progress Notes (Signed)
Notified Viyouh, MD via text at [facility] that pt's HR is in the 120-140's. Pt is asymptomatic. Pt's pacemaker is not pacing at this time. Pt states that he was fighting with his covers. No call returned. Text paged Inis Sizer, MD at [facility]. Will continue to monitor pt. Ranelle Oyster, RN

## 2014-02-22 NOTE — Progress Notes (Signed)
Pt's HR is 81 now and pacemaker is pacing after Metoprolol 5mg  IV. Will continue to monitor pt. Ranelle Oyster, RN

## 2014-02-22 NOTE — Progress Notes (Signed)
Called April rapid response, RN at 0056 that pt's HR is in the 120-140's (sinus tach) and pacemaker is not pacing. Gave scheduled Metoprolol 5mg  IV at 2346. April rapid response, RN came to unit. Paged Inis Sizer, MD third time. No return call received. Called Walden Field, NP and orders received will continue to monitor pt. Hassan Buckler

## 2014-02-22 NOTE — Progress Notes (Signed)
PATIENT DETAILS Name: Samuel Cass Welshans Sr. Age: 80 y.o. Sex: male Date of Birth: 1922-01-28 Admit Date: 02/20/2014 Admitting Physician Cristal Ford, DO Samuel Mann,Samuel DAVIDSON, MD  Subjective: Passing flatus, significant less abdominal pain.  Assessment/Plan: Small bowel obstruction likely secondary to adhesion  -Patient was admitted, kept n.p.o., started on IV fluids and NG tube was placed. General surgery was consulted.  - Passing flatus, did have a small bowel movement on 8/13. X-ray abdomen on 8/15 showing improvement, seen by general surgery today it was clamped NG tube. Plan is to continue supportive care, if does well, NG tube will be removed on 8/16 and clear liquid diet will be started. Abdomen is very soft and exam without any distention or tenderness.   ARF -likely pre-renal-however did contrast for his CT Abd on 8/13. UA shows no proteinuria - Resolved with IVF, cautiously continue with IV fluids as n.p.o.  Hypernatremia - Resolved with 0.45 NS. Recheck lytes in am  Paroxysmal atrial fibrillation  - Rate controlled, continue with IV Lopressor for now, switched back to oral metoprolol when able. - Previously was on Coumadin, however per patient -his PCP after discussion with cardiologist discontinued approximately 2 months back. Currently will not plan on restarting at this point  Run of SVT - Did have a run of narrow complex tachycardia last night. Continue to monitor in telemetry. Patient asymptomatic.  History of nonischemic cardiomyopathy with CHF  - Echocardiogram done this admission shows improved EF at 55-60%. Clinically euvolemic. Continue to monitor cautiously.  Hyperlipidemia  -Will hold statin until SBO resolves   Leukocytosis  -Likely reactive to above  -WBC decreased to normal level -Will continue to monitor CBC  Disposition: Remain inpatient  DVT Prophylaxis: Lovenox   Code Status: Full code   Family Communication Spouse at  bedside  Procedures:  None  CONSULTS:  general surgery  Time spent 40 minutes-which includes 50% of the time with face-to-face with patient/ family and coordinating care related to the above assessment and plan.   MEDICATIONS: Scheduled Meds: . antiseptic oral rinse  7 mL Mouth Rinse BID  . enoxaparin (LOVENOX) injection  40 mg Subcutaneous Q24H  . ipratropium  1 spray Each Nare Daily  . metoprolol  5 mg Intravenous 4 times per day   Continuous Infusions: . sodium chloride 75 mL/hr at 02/21/14 2129   PRN Meds:.acetaminophen, acetaminophen, morphine injection, ondansetron (ZOFRAN) IV, ondansetron  Antibiotics: Anti-infectives   None       PHYSICAL EXAM: Vital signs in last 24 hours: Filed Vitals:   02/21/14 2058 02/21/14 2345 02/22/14 0120 02/22/14 0552  BP: 145/73 121/66 112/61 124/70  Pulse: 71 120 131 92  Temp: 98.4 F (36.9 C)   98.1 F (36.7 C)  TempSrc: Oral   Oral  Resp: 18   18  Height:      Weight:    72.1 kg (158 lb 15.2 oz)  SpO2: 96%   94%    Weight change: 0.4 kg (14.1 oz) Filed Weights   02/20/14 1038 02/21/14 0436 02/22/14 0552  Weight: 71.7 kg (158 lb 1.1 oz) 72 kg (158 lb 11.7 oz) 72.1 kg (158 lb 15.2 oz)   Body mass index is 24.89 kg/(m^2).   Gen Exam: Awake and alert with clear speech.   Neck: Supple, No JVD.   Chest: B/L Clear.   CVS: S1 S2 Regular, no murmurs.  Abdomen: soft, BS +, mildly tender to palpation of lower abdomen, pain decreasing, non  distended.  Extremities: no edema, lower extremities warm to touch. Neurologic: Non Focal.   Skin: No Rash.   Wounds: N/A.    Intake/Output from previous day:  Intake/Output Summary (Last 24 hours) at 02/22/14 1231 Last data filed at 02/22/14 0853  Gross per 24 hour  Intake  872.5 ml  Output      0 ml  Net  872.5 ml     LAB RESULTS: CBC  Recent Labs Lab 02/20/14 0445 02/21/14 0516 02/22/14 0508  WBC 16.6* 9.2 10.4  HGB 14.7 12.3* 11.7*  HCT 44.4 38.0* 36.2*  PLT 213  165 168  MCV 93.1 94.8 94.3  MCH 30.8 30.7 30.5  MCHC 33.1 32.4 32.3  RDW 13.3 13.8 13.8  LYMPHSABS 0.4*  --   --   MONOABS 1.1*  --   --   EOSABS 0.0  --   --   BASOSABS 0.0  --   --     Chemistries   Recent Labs Lab 02/20/14 0445 02/21/14 0516 02/21/14 1030 02/22/14 0508  NA 146 147  --  145  K 4.8 4.9  --  4.6  CL 107 110  --  108  CO2 25 25  --  21  GLUCOSE 162* 92  --  63*  BUN 25* 38*  --  33*  CREATININE 1.18 1.57*  --  1.31  CALCIUM 9.3 8.1*  --  8.5  MG 1.9  --  1.8  --     No components found with this basename: POCBNP,  No results found for this basename: DDIMER,  in the last 72 hours No results found for this basename: HGBA1C,  in the last 72 hours No results found for this basename: CHOL, HDL, LDLCALC, TRIG, CHOLHDL, LDLDIRECT,  in the last 72 hours  Recent Labs  02/20/14 0445  TSH 5.420*    RADIOLOGY STUDIES/RESULTS: Ct Abdomen Pelvis W Contrast  02/20/2014   CLINICAL DATA:  Left lower quadrant pain beginning last night.  EXAM: CT ABDOMEN AND PELVIS WITH CONTRAST  TECHNIQUE: Multidetector CT imaging of the abdomen and pelvis was performed using the standard protocol following bolus administration of intravenous contrast.  CONTRAST:  100 mL OMNIPAQUE IOHEXOL 300 MG/ML  SOLN  COMPARISON:  CT abdomen and pelvis 03/08/2008.  FINDINGS: A 0.3 cm subpleural nodule in the right lung base on image 5 is unchanged. Dependent atelectasis is noted. There is no pleural or pericardial effusion. Pacing leads are noted.  Small bowel loops are mildly dilated at 3.1 cm with air fluid levels present. The stomach is distended. A transition point is identified in the right lower quadrant of the abdomen where distal ileal loops are completely decompressed. Bowel contents just proximal to this transition point contain fecal type material. The colon is nearly completely decompressed. Surgical anastomosis in the colon is seen at the hepatic flexure with the patient is status post  subtotal right colectomy. Obstruction is not related to the surgical anastomosis. There is a small volume of abdominal and pelvic ascites.  A 0.3 cm hypo attenuating lesion in the dome the liver on image 13 is identified. The liver is otherwise unremarkable. Bilateral renal cysts are unchanged. The adrenal glands, spleen, pancreas and gallbladder are unremarkable. No lymphadenopathy is seen. There is no focal fluid collection. No lytic or sclerotic bony lesion is seen.  IMPRESSION: Study is positive for small bowel obstruction. The obstruction is likely due to adhesions with a transition point identified in the right lower quadrant. No mass  is seen.  Small volume of abdominal and pelvic ascites likely related to obstruction.  0.3 cm in diameter hypoattenuating lesion in the dome the liver appears new since the prior study. It cannot be definitively characterized but likely represents a cyst. Recommend attention on follow-up examinations.  Status post subtotal right hemicolectomy.   Electronically Signed   By: Inge Rise M.D.   On: 02/20/2014 07:53   Dg Abd 2 Views  02/21/2014   CLINICAL DATA:  80 year old male with small bowel obstruction and enteric tube placement. Initial encounter.  EXAM: ABDOMEN - 2 VIEW  COMPARISON:  Radiographs and CT Abdomen and Pelvis 02/20/2014 and earlier.  FINDINGS: Upright and supine radiographs.  Enteric tube in place, tip at the gastric antrum. Side hole at the gastric body.  Possible small right pleural effusion. No pneumoperitoneum on today upright images.  Gas-filled small bowel loops measuring up to 3 cm diameter. Small volume oral contrast now suspected in the left colon. Colonic gas has mildly increased since the most recent radiographs. Staple lines re - identified in the right abdomen. Small volume excreted IV contrast in the bladder. No acute osseous abnormality identified.  IMPRESSION: 1. Similar bowel gas pattern since the recent CT Abdomen and Pelvis, compatible  with partial small bowel obstruction. No free air. 2. Enteric tube in place, side hole the gastric body. 3. Suggestion of small right pleural effusion.   Electronically Signed   By: Lars Pinks M.D.   On: 02/21/2014 08:08   Dg Abd Portable 1v  02/20/2014   CLINICAL DATA:  NG tube placement  EXAM: PORTABLE ABDOMEN - 1 VIEW  COMPARISON:  None.  FINDINGS: There is nonspecific nonobstructive bowel gas pattern. NG tube with tip in proximal duodenum.  IMPRESSION: NG tube in place.   Electronically Signed   By: Lahoma Crocker M.D.   On: 02/20/2014 12:42    [employer]   [facility] Pager:[phone]  If 7PM-7AM, please contact night-coverage [email or web address] Password [email or web address] 02/22/2014, 12:31 PM   LOS: 2 days   **Disclaimer: This note may have been dictated with voice recognition software. Similar sounding words can inadvertently be transcribed and this note may contain transcription errors which may not have been corrected upon publication of note.**

## 2014-02-22 NOTE — Progress Notes (Signed)
General Surgery Note  LOS: 2 days  POD -    PCP - [facility] [place] Assessment/Plan: 1.  SBO   He had a open right hemicolectomy with resection of distal ileum - 03/11/2008 for benign disease - T. Rosenbower.  Has contrast in colon on today's KUB  Will clamp NGT - if he does well with this.  Can remove NGT in AM and start clear liquids.  2.  Acute renal failure  Creatinine returning to normal  3.  Paroxysmal A fib - Sees Dr. Sallyanne Kuster 4.  History of nonischemic cardiomyopathy with CHF  5.  Has pacemaker 6.  HTN 7.  DVT prophylaxis - Lovenox   Active Problems:   SBO (small bowel obstruction)   Small bowel obstruction  Subjective:  Passing flatus, no BM.  Discussed manangement with patient.  He wants to leave the NGT, but will clamp.  Daughter, Onalee Hua, in room with patient.  Objective:   Filed Vitals:   02/22/14 0552  BP: 124/70  Pulse: 92  Temp: 98.1 F (36.7 C)  Resp: 18     Intake/Output from previous day:  08/14 0701 - 08/15 0700 In: 872.5 [I.V.:872.5] Out: 100 [Urine:100]  Intake/Output this shift:      Physical Exam:   General: WN older WM who is alert and oriented.    HEENT: Normal. Pupils equal. .   Lungs: Clear.   Abdomen: Soft.  BS present   Lab Results:    Recent Labs  02/21/14 0516 02/22/14 0508  WBC 9.2 10.4  HGB 12.3* 11.7*  HCT 38.0* 36.2*  PLT 165 168    BMET   Recent Labs  02/21/14 0516 02/22/14 0508  NA 147 145  K 4.9 4.6  CL 110 108  CO2 25 21  GLUCOSE 92 63*  BUN 38* 33*  CREATININE 1.57* 1.31  CALCIUM 8.1* 8.5    PT/INR  No results found for this basename: LABPROT, INR,  in the last 72 hours  ABG  No results found for this basename: PHART, PCO2, PO2, HCO3,  in the last 72 hours   Studies/Results:  Dg Abd 2 Views  02/21/2014   CLINICAL DATA:  80 year old male with small bowel obstruction and enteric tube placement. Initial encounter.  EXAM: ABDOMEN - 2 VIEW  COMPARISON:  Radiographs and CT Abdomen and Pelvis  02/20/2014 and earlier.  FINDINGS: Upright and supine radiographs.  Enteric tube in place, tip at the gastric antrum. Side hole at the gastric body.  Possible small right pleural effusion. No pneumoperitoneum on today upright images.  Gas-filled small bowel loops measuring up to 3 cm diameter. Small volume oral contrast now suspected in the left colon. Colonic gas has mildly increased since the most recent radiographs. Staple lines re - identified in the right abdomen. Small volume excreted IV contrast in the bladder. No acute osseous abnormality identified.  IMPRESSION: 1. Similar bowel gas pattern since the recent CT Abdomen and Pelvis, compatible with partial small bowel obstruction. No free air. 2. Enteric tube in place, side hole the gastric body. 3. Suggestion of small right pleural effusion.   Electronically Signed   By: Lars Pinks M.D.   On: 02/21/2014 08:08   Dg Abd Portable 1v  02/20/2014   CLINICAL DATA:  NG tube placement  EXAM: PORTABLE ABDOMEN - 1 VIEW  COMPARISON:  None.  FINDINGS: There is nonspecific nonobstructive bowel gas pattern. NG tube with tip in proximal duodenum.  IMPRESSION: NG tube in place.   Electronically  Signed   By: Lahoma Crocker M.D.   On: 02/20/2014 12:42     Anti-infectives:   Anti-infectives   None      Alphonsa Overall, MD, FACS Pager: [phone] Surgery Office: [phone] 02/22/2014

## 2014-02-23 ENCOUNTER — Inpatient Hospital Stay (HOSPITAL_COMMUNITY): Payer: Medicare Other

## 2014-02-23 LAB — CBC
HEMATOCRIT: 37 % — AB (ref 39.0–52.0)
HEMOGLOBIN: 12.1 g/dL — AB (ref 13.0–17.0)
MCH: 30.7 pg (ref 26.0–34.0)
MCHC: 32.7 g/dL (ref 30.0–36.0)
MCV: 93.9 fL (ref 78.0–100.0)
Platelets: 161 10*3/uL (ref 150–400)
RBC: 3.94 MIL/uL — ABNORMAL LOW (ref 4.22–5.81)
RDW: 13.2 % (ref 11.5–15.5)
WBC: 9.7 10*3/uL (ref 4.0–10.5)

## 2014-02-23 LAB — BASIC METABOLIC PANEL
Anion gap: 17 — ABNORMAL HIGH (ref 5–15)
BUN: 26 mg/dL — ABNORMAL HIGH (ref 6–23)
CALCIUM: 8.1 mg/dL — AB (ref 8.4–10.5)
CO2: 18 mEq/L — ABNORMAL LOW (ref 19–32)
CREATININE: 1.14 mg/dL (ref 0.50–1.35)
Chloride: 106 mEq/L (ref 96–112)
GFR, EST AFRICAN AMERICAN: 63 mL/min — AB (ref >90)
GFR, EST NON AFRICAN AMERICAN: 55 mL/min — AB (ref >90)
GLUCOSE: 73 mg/dL (ref 70–99)
Potassium: 4 mEq/L (ref 3.7–5.3)
Sodium: 141 mEq/L (ref 137–147)

## 2014-02-23 LAB — MAGNESIUM: Magnesium: 2.2 mg/dL (ref 1.5–2.5)

## 2014-02-23 MED ORDER — METOPROLOL SUCCINATE ER 50 MG PO TB24
50.0000 mg | ORAL_TABLET | Freq: Every day | ORAL | Status: DC
  Administered 2014-02-23 – 2014-02-25 (×3): 50 mg via ORAL
  Filled 2014-02-23 (×3): qty 1

## 2014-02-23 MED ORDER — METOPROLOL TARTRATE 1 MG/ML IV SOLN: 5.0000 mg | INTRAVENOUS | Status: DC | PRN

## 2014-02-23 MED ORDER — GUAIFENESIN 100 MG/5ML PO SYRP
200.0000 mg | ORAL_SOLUTION | ORAL | Status: DC | PRN
  Administered 2014-02-25: 200 mg via ORAL
  Filled 2014-02-23 (×2): qty 10

## 2014-02-23 NOTE — Progress Notes (Signed)
Pt's HR 180s following ambulation. VS taken, BP 163/70 and HR sustaining at 110. MD contact and orders received.

## 2014-02-23 NOTE — Progress Notes (Signed)
PATIENT DETAILS Name: Samuel Geerts Schroyer Sr. Age: 80 y.o. Sex: male Date of Birth: 04-02-1922 Admit Date: 02/20/2014 Admitting Physician Cristal Ford, DO FBP:ZWCHE,NIDPOE DAVIDSON, MD  Subjective: Passing flatus, no BM yet.  Assessment/Plan: Small bowel obstruction likely secondary to adhesion  -Patient was admitted, kept n.p.o., started on IV fluids and NG tube was placed. General surgery was consulted.  - Passing flatus, did have a small bowel movement on 8/13. X-ray abdomen on 8/15 and 8/16 showing improvement, seen by general surgery-clamped NG tube on 8/15 and removed on 8/16. Started on clear liquids.. Abdomen is very soft and exam without any distention or tenderness.   ARF -likely pre-renal-however did contrast for his CT Abd on 8/13. UA shows no proteinuria - Resolved with IVF, cautiously continue with IV fluids as n.p.o.  Hypernatremia - Resolved with 0.45 NS. Recheck lytes in am  Paroxysmal atrial fibrillation  - Rate controlled, continue with IV Lopressor for now, switched back to oral metoprolol when able. - Previously was on Coumadin, however per patient -his PCP after discussion with cardiologist discontinued approximately 2 months back. Currently will not plan on restarting at this point  Run of SVT - Did have a run of narrow complex tachycardia last night. Continue to monitor in telemetry. Patient asymptomatic.  History of nonischemic cardiomyopathy with CHF  - Echocardiogram done this admission shows improved EF at 55-60%. Clinically euvolemic. Continue to monitor cautiously.  Hyperlipidemia  -Will hold statin until SBO resolves   Leukocytosis  -Likely reactive to above  -WBC decreased to normal level -Will continue to monitor CBC  Disposition: Remain inpatient  DVT Prophylaxis: Lovenox   Code Status: Full code   Family Communication Spouse at bedside  Procedures:  None  CONSULTS:  general surgery   MEDICATIONS: Scheduled Meds: .  antiseptic oral rinse  7 mL Mouth Rinse BID  . enoxaparin (LOVENOX) injection  40 mg Subcutaneous Q24H  . ipratropium  1 spray Each Nare Daily  . metoprolol  5 mg Intravenous 4 times per day   Continuous Infusions: . sodium chloride 75 mL/hr at 02/23/14 0143   PRN Meds:.acetaminophen, acetaminophen, morphine injection, ondansetron (ZOFRAN) IV, ondansetron  Antibiotics: Anti-infectives   None       PHYSICAL EXAM: Vital signs in last 24 hours: Filed Vitals:   02/22/14 1516 02/22/14 2030 02/22/14 2353 02/23/14 0432  BP: 121/65 136/71 134/66 148/65  Pulse: 79 66 74 63  Temp: 98.2 F (36.8 C) 98.1 F (36.7 C)  97.7 F (36.5 C)  TempSrc: Oral Oral  Oral  Resp: 18 18  18   Height:      Weight:    72.1 kg (158 lb 15.2 oz)  SpO2: 95% 96%  93%    Weight change: 0 kg (0 lb) Filed Weights   02/21/14 0436 02/22/14 0552 02/23/14 0432  Weight: 72 kg (158 lb 11.7 oz) 72.1 kg (158 lb 15.2 oz) 72.1 kg (158 lb 15.2 oz)   Body mass index is 24.89 kg/(m^2).   Gen Exam: Awake and alert with clear speech.   Neck: Supple, No JVD.   Chest: B/L Clear.  No rales CVS: S1 S2 Regular, no murmurs.  Abdomen: soft, BS +, mildly tender to palpation of lower abdomen, pain decreasing, non distended.  Extremities: no edema, lower extremities warm to touch. Neurologic: Non Focal.   Skin: No Rash.   Wounds: N/A.    Intake/Output from previous day: No intake or output data in the 24  hours ending 02/23/14 1136   LAB RESULTS: CBC  Recent Labs Lab 02/20/14 0445 02/21/14 0516 02/22/14 0508 02/23/14 0555  WBC 16.6* 9.2 10.4 9.7  HGB 14.7 12.3* 11.7* 12.1*  HCT 44.4 38.0* 36.2* 37.0*  PLT 213 165 168 161  MCV 93.1 94.8 94.3 93.9  MCH 30.8 30.7 30.5 30.7  MCHC 33.1 32.4 32.3 32.7  RDW 13.3 13.8 13.8 13.2  LYMPHSABS 0.4*  --   --   --   MONOABS 1.1*  --   --   --   EOSABS 0.0  --   --   --   BASOSABS 0.0  --   --   --     Chemistries   Recent Labs Lab 02/20/14 0445 02/21/14 0516  02/21/14 1030 02/22/14 0508 02/23/14 0555  NA 146 147  --  145 141  K 4.8 4.9  --  4.6 4.0  CL 107 110  --  108 106  CO2 25 25  --  21 18*  GLUCOSE 162* 92  --  63* 73  BUN 25* 38*  --  33* 26*  CREATININE 1.18 1.57*  --  1.31 1.14  CALCIUM 9.3 8.1*  --  8.5 8.1*  MG 1.9  --  1.8  --  2.2    No components found with this basename: POCBNP,  No results found for this basename: DDIMER,  in the last 72 hours No results found for this basename: HGBA1C,  in the last 72 hours No results found for this basename: CHOL, HDL, LDLCALC, TRIG, CHOLHDL, LDLDIRECT,  in the last 72 hours No results found for this basename: TSH, T4TOTAL, FREET3, T3FREE, THYROIDAB,  in the last 72 hours  RADIOLOGY STUDIES/RESULTS: Ct Abdomen Pelvis W Contrast  02/20/2014   CLINICAL DATA:  Left lower quadrant pain beginning last night.  EXAM: CT ABDOMEN AND PELVIS WITH CONTRAST  TECHNIQUE: Multidetector CT imaging of the abdomen and pelvis was performed using the standard protocol following bolus administration of intravenous contrast.  CONTRAST:  100 mL OMNIPAQUE IOHEXOL 300 MG/ML  SOLN  COMPARISON:  CT abdomen and pelvis 03/08/2008.  FINDINGS: A 0.3 cm subpleural nodule in the right lung base on image 5 is unchanged. Dependent atelectasis is noted. There is no pleural or pericardial effusion. Pacing leads are noted.  Small bowel loops are mildly dilated at 3.1 cm with air fluid levels present. The stomach is distended. A transition point is identified in the right lower quadrant of the abdomen where distal ileal loops are completely decompressed. Bowel contents just proximal to this transition point contain fecal type material. The colon is nearly completely decompressed. Surgical anastomosis in the colon is seen at the hepatic flexure with the patient is status post subtotal right colectomy. Obstruction is not related to the surgical anastomosis. There is a small volume of abdominal and pelvic ascites.  A 0.3 cm hypo  attenuating lesion in the dome the liver on image 13 is identified. The liver is otherwise unremarkable. Bilateral renal cysts are unchanged. The adrenal glands, spleen, pancreas and gallbladder are unremarkable. No lymphadenopathy is seen. There is no focal fluid collection. No lytic or sclerotic bony lesion is seen.  IMPRESSION: Study is positive for small bowel obstruction. The obstruction is likely due to adhesions with a transition point identified in the right lower quadrant. No mass is seen.  Small volume of abdominal and pelvic ascites likely related to obstruction.  0.3 cm in diameter hypoattenuating lesion in the dome the liver appears  new since the prior study. It cannot be definitively characterized but likely represents a cyst. Recommend attention on follow-up examinations.  Status post subtotal right hemicolectomy.   Electronically Signed   By: Inge Rise M.D.   On: 02/20/2014 07:53   Dg Abd 2 Views  02/21/2014   CLINICAL DATA:  79 year old male with small bowel obstruction and enteric tube placement. Initial encounter.  EXAM: ABDOMEN - 2 VIEW  COMPARISON:  Radiographs and CT Abdomen and Pelvis 02/20/2014 and earlier.  FINDINGS: Upright and supine radiographs.  Enteric tube in place, tip at the gastric antrum. Side hole at the gastric body.  Possible small right pleural effusion. No pneumoperitoneum on today upright images.  Gas-filled small bowel loops measuring up to 3 cm diameter. Small volume oral contrast now suspected in the left colon. Colonic gas has mildly increased since the most recent radiographs. Staple lines re - identified in the right abdomen. Small volume excreted IV contrast in the bladder. No acute osseous abnormality identified.  IMPRESSION: 1. Similar bowel gas pattern since the recent CT Abdomen and Pelvis, compatible with partial small bowel obstruction. No free air. 2. Enteric tube in place, side hole the gastric body. 3. Suggestion of small right pleural effusion.    Electronically Signed   By: Lars Pinks M.D.   On: 02/21/2014 08:08   Dg Abd Portable 1v  02/20/2014   CLINICAL DATA:  NG tube placement  EXAM: PORTABLE ABDOMEN - 1 VIEW  COMPARISON:  None.  FINDINGS: There is nonspecific nonobstructive bowel gas pattern. NG tube with tip in proximal duodenum.  IMPRESSION: NG tube in place.   Electronically Signed   By: Lahoma Crocker M.D.   On: 02/20/2014 12:42    [employer]   [facility] Pager:[phone]  If 7PM-7AM, please contact night-coverage [email or web address] Password [email or web address] 02/23/2014, 11:36 AM   LOS: 3 days   **Disclaimer: This note may have been dictated with voice recognition software. Similar sounding words can inadvertently be transcribed and this note may contain transcription errors which may not have been corrected upon publication of note.**

## 2014-02-23 NOTE — Progress Notes (Signed)
General Surgery Note  LOS: 3 days  POD -    PCP - [facility] Assessment/Plan: 1.  SBO   He had a open right hemicolectomy with resection of distal ileum - 03/11/2008 for benign disease - T. Rosenbower.  KUB looks good, patient looks good.  NGT out and start clear liquids.  2.  Acute renal failure  Creatinine returning to normal 3.  Paroxysmal A fib - Sees Dr. Sallyanne Kuster 4.  History of nonischemic cardiomyopathy with CHF  5.  Has pacemaker 6.  HTN 7.  DVT prophylaxis - Lovenox   Active Problems:   SBO (small bowel obstruction)   Small bowel obstruction  Subjective:  Passing flatus.  No abdominal pain.  Has not been out of bed much.  No one in room with patient.  Objective:   Filed Vitals:   02/23/14 0432  BP: 148/65  Pulse: 63  Temp: 97.7 F (36.5 C)  Resp: 18     Intake/Output from previous day:     Intake/Output this shift:      Physical Exam:   General: WN older WM who is alert and oriented.    HEENT: Normal. Pupils equal. .   Lungs: Clear.   Abdomen: Soft.  BS present   Lab Results:     Recent Labs  02/22/14 0508 02/23/14 0555  WBC 10.4 9.7  HGB 11.7* 12.1*  HCT 36.2* 37.0*  PLT 168 161    BMET    Recent Labs  02/22/14 0508 02/23/14 0555  NA 145 141  K 4.6 4.0  CL 108 106  CO2 21 18*  GLUCOSE 63* 73  BUN 33* 26*  CREATININE 1.31 1.14  CALCIUM 8.5 8.1*    PT/INR  No results found for this basename: LABPROT, INR,  in the last 72 hours  ABG  No results found for this basename: PHART, PCO2, PO2, HCO3,  in the last 72 hours   Studies/Results:  Dg Abd 2 Views  02/23/2014   CLINICAL DATA:  Abdominal pain, small-bowel obstruction  EXAM: ABDOMEN - 2 VIEW  COMPARISON:  02/22/2014  FINDINGS: There is gas and stool throughout the colon rectosigmoid colon. No dilated loops of large or small bowel. No free air beneath hemidiaphragms on upright exam. There is small right effusion  IMPRESSION: No evidence of bowel obstruction.  Small  right effusion.   Electronically Signed   By: Suzy Bouchard M.D.   On: 02/23/2014 09:30   Dg Abd 2 Views  02/22/2014   CLINICAL DATA:  Recent abdominal pain, short of breath  EXAM: ABDOMEN - 2 VIEW  COMPARISON:  Abdominal film 02/21/2014  FINDINGS: NG tube extends to the stomach. There is interval decrease in the number of dilated loops of small bowel. Additionally the diameter of the loops of bowel is decreased. There is gas and stool in the rectum. Surgical clips in the right abdomen.  IMPRESSION: Improvement in small bowel obstruction pattern.   Electronically Signed   By: Suzy Bouchard M.D.   On: 02/22/2014 11:36     Anti-infectives:   Anti-infectives   None      Alphonsa Overall, MD, FACS Pager: [phone] Surgery Office: [phone] 02/23/2014

## 2014-02-24 NOTE — Progress Notes (Signed)
Ambulating.  Feeling much better. Patient reports BM today, but not recorded. Advance diet as tolerated.   Possible discharge tomorrow.  Imogene Burn. Georgette Dover, MD, [facility] Surgery  General/ Trauma Surgery  02/24/2014 4:26 PM

## 2014-02-24 NOTE — Progress Notes (Signed)
  Subjective: Having some flatus, no BM, no record of what he is taking in.   He feels better. Objective: Vital signs in last 24 hours: Temp:  [97.6 F (36.4 C)-98.8 F (37.1 C)] 97.8 F (36.6 C) (08/17 0430) Pulse Rate:  [60-100] 76 (08/17 0451) Resp:  [18] 18 (08/17 0430) BP: (150-170)/(70-88) 159/79 mmHg (08/17 0451) SpO2:  [92 %-96 %] 92 % (08/17 0430) Weight:  [70.1 kg (154 lb 8.7 oz)] 70.1 kg (154 lb 8.7 oz) (08/17 0448) Last BM Date: 02/20/14 I/o not recorded. Diet: clear liquids started yesterday Afebrile VSS No labs this AM Intake/Output from previous day:   Intake/Output this shift:    General appearance: alert, cooperative and no distress GI: soft, nontender, no distension, + BS, a little high pitched.    Lab Results:   Recent Labs  02/22/14 0508 2014-03-06 0555  WBC 10.4 9.7  HGB 11.7* 12.1*  HCT 36.2* 37.0*  PLT 168 161    BMET  Recent Labs  02/22/14 0508 Mar 06, 2014 0555  NA 145 141  K 4.6 4.0  CL 108 106  CO2 21 18*  GLUCOSE 63* 73  BUN 33* 26*  CREATININE 1.31 1.14  CALCIUM 8.5 8.1*   PT/INR No results found for this basename: LABPROT, INR,  in the last 72 hours   Recent Labs Lab 02/20/14 0445 02/21/14 0516  AST 25 26  ALT 12 11  ALKPHOS 111 71  BILITOT 0.5 0.5  PROT 7.4 5.6*  ALBUMIN 4.1 2.9*     Lipase     Component Value Date/Time   LIPASE 22 05/08/2012 0148     Studies/Results: Dg Abd 2 Views  06-Mar-2014   CLINICAL DATA:  Abdominal pain, small-bowel obstruction  EXAM: ABDOMEN - 2 VIEW  COMPARISON:  02/22/2014  FINDINGS: There is gas and stool throughout the colon rectosigmoid colon. No dilated loops of large or small bowel. No free air beneath hemidiaphragms on upright exam. There is small right effusion  IMPRESSION: No evidence of bowel obstruction.  Small right effusion.   Electronically Signed   By: Suzy Bouchard M.D.   On: 03-06-14 09:30   Dg Abd 2 Views  02/22/2014   CLINICAL DATA:  Recent abdominal pain,  short of breath  EXAM: ABDOMEN - 2 VIEW  COMPARISON:  Abdominal film 02/21/2014  FINDINGS: NG tube extends to the stomach. There is interval decrease in the number of dilated loops of small bowel. Additionally the diameter of the loops of bowel is decreased. There is gas and stool in the rectum. Surgical clips in the right abdomen.  IMPRESSION: Improvement in small bowel obstruction pattern.   Electronically Signed   By: Suzy Bouchard M.D.   On: 02/22/2014 11:36    Medications: . antiseptic oral rinse  7 mL Mouth Rinse BID  . enoxaparin (LOVENOX) injection  40 mg Subcutaneous Q24H  . ipratropium  1 spray Each Nare Daily  . metoprolol succinate  50 mg Oral Daily    Assessment/Plan SBO Acute renal failure - resolving PAF NICM with CHF/PTVP Hypertension   Plan:  I told him we would up him to fulls at lunch, and to back off if he had any distension or nausea.  The more he walks the better.   LOS: 4 days    Samuel Mann 02/24/2014

## 2014-02-24 NOTE — Progress Notes (Signed)
PATIENT DETAILS Name: Samuel Borges Coupe Sr. Age: 80 y.o. Sex: male Date of Birth: 1922/03/22 Admit Date: 02/20/2014 Admitting Physician Cristal Ford, DO GYF:VCBSW,HQPRFF DAVIDSON, MD  Subjective: Doing well this morning. Denies pain, nausea, vomiting. Has been ambulating. Eating full liquid diet without problems.   Assessment/Plan: Active Problems:   SBO (small bowel obstruction)   Small bowel obstruction   Primary Problem: Small bowel obstruction likely secondary to adhesion  -Patient was admitted, kept n.p.o., started on IV fluids and NG tube was placed. General surgery was consulted.  -Passing flatus, did have a small bowel movement on 8/13. X-ray abdomen on 8/15 and 8/16 showing improvement  -Seen by general surgery. Clamped NG tube on 8/15 and removed on 8/16. Started on clear liquids, now advanced to full liquids.Abdomen is very soft and exam without any distention or tenderness.  - Will continue to advance diet as long as patient tolerates it  ARF  -likely pre-renal-however did contrast for his CT Abd on 8/13. UA shows no proteinuria  -Resolved with IVF  Hypernatremia  - Resolved with 0.45 NS  Paroxysmal atrial fibrillation  - Rate controlled, stable with oral metoprolol   - Previously was on Coumadin, however per patient -his PCP after discussion with cardiologist discontinued approximately 2 months back. Currently will not plan on restarting at this point   Run of SVT  - Did have a run of narrow complex tachycardia last night. Continue to monitor in telemetry. Patient asymptomatic.   History of nonischemic cardiomyopathy with CHF  - Echocardiogram done this admission shows improved EF at 55-60%. Clinically euvolemic. Continue to monitor cautiously.   Hyperlipidemia  -Will hold statin until SBO resolves   Leukocytosis  -Likely reactive to above -WBC normalized  Disposition: Remain inpatient  DVT Prophylaxis: Prophylactic Lovenox   Code  Status: Full code   Family Communication None  Procedures:  None  CONSULTS:  general surgery   MEDICATIONS: Scheduled Meds: . antiseptic oral rinse  7 mL Mouth Rinse BID  . enoxaparin (LOVENOX) injection  40 mg Subcutaneous Q24H  . ipratropium  1 spray Each Nare Daily  . metoprolol succinate  50 mg Oral Daily   Continuous Infusions: . sodium chloride 40 mL/hr at 02/23/14 1856   PRN Meds:.acetaminophen, acetaminophen, guaifenesin, metoprolol, morphine injection, ondansetron (ZOFRAN) IV, ondansetron  Antibiotics: Anti-infectives   None       PHYSICAL EXAM: Vital signs in last 24 hours: Filed Vitals:   02/23/14 2046 02/24/14 0430 02/24/14 0448 02/24/14 0451  BP: 150/82 170/88  159/79  Pulse: 80 70  76  Temp: 98.8 F (37.1 C) 97.8 F (36.6 C)    TempSrc: Oral Oral    Resp: 18 18    Height:      Weight:   70.1 kg (154 lb 8.7 oz)   SpO2: 93% 92%      Weight change: -2 kg (-4 lb 6.6 oz) Filed Weights   02/22/14 0552 02/23/14 0432 02/24/14 0448  Weight: 72.1 kg (158 lb 15.2 oz) 72.1 kg (158 lb 15.2 oz) 70.1 kg (154 lb 8.7 oz)   Body mass index is 24.2 kg/(m^2).   Gen Exam: Awake and alert with clear speech.   Neck: Supple, No JVD.   Chest: B/L Clear.   CVS: S1 S2 Regular, no murmurs.  Abdomen: soft, BS +, non tender, non distended.  Extremities: no edema, lower extremities warm to touch. Neurologic: Non Focal.   Skin: No Rash.   Wounds:  N/A.    Intake/Output from previous day:  Intake/Output Summary (Last 24 hours) at 02/24/14 0944 Last data filed at 02/24/14 0900  Gross per 24 hour  Intake    360 ml  Output      0 ml  Net    360 ml     LAB RESULTS: CBC  Recent Labs Lab 02/20/14 0445 02/21/14 0516 02/22/14 0508 02/23/14 0555  WBC 16.6* 9.2 10.4 9.7  HGB 14.7 12.3* 11.7* 12.1*  HCT 44.4 38.0* 36.2* 37.0*  PLT 213 165 168 161  MCV 93.1 94.8 94.3 93.9  MCH 30.8 30.7 30.5 30.7  MCHC 33.1 32.4 32.3 32.7  RDW 13.3 13.8 13.8 13.2   LYMPHSABS 0.4*  --   --   --   MONOABS 1.1*  --   --   --   EOSABS 0.0  --   --   --   BASOSABS 0.0  --   --   --     Chemistries   Recent Labs Lab 02/20/14 0445 02/21/14 0516 02/21/14 1030 02/22/14 0508 02/23/14 0555  NA 146 147  --  145 141  K 4.8 4.9  --  4.6 4.0  CL 107 110  --  108 106  CO2 25 25  --  21 18*  GLUCOSE 162* 92  --  63* 73  BUN 25* 38*  --  33* 26*  CREATININE 1.18 1.57*  --  1.31 1.14  CALCIUM 9.3 8.1*  --  8.5 8.1*  MG 1.9  --  1.8  --  2.2    RADIOLOGY STUDIES/RESULTS: Ct Abdomen Pelvis W Contrast  02/20/2014   CLINICAL DATA:  Left lower quadrant pain beginning last night.  EXAM: CT ABDOMEN AND PELVIS WITH CONTRAST  TECHNIQUE: Multidetector CT imaging of the abdomen and pelvis was performed using the standard protocol following bolus administration of intravenous contrast.  CONTRAST:  100 mL OMNIPAQUE IOHEXOL 300 MG/ML  SOLN  COMPARISON:  CT abdomen and pelvis 03/08/2008.  FINDINGS: A 0.3 cm subpleural nodule in the right lung base on image 5 is unchanged. Dependent atelectasis is noted. There is no pleural or pericardial effusion. Pacing leads are noted.  Small bowel loops are mildly dilated at 3.1 cm with air fluid levels present. The stomach is distended. A transition point is identified in the right lower quadrant of the abdomen where distal ileal loops are completely decompressed. Bowel contents just proximal to this transition point contain fecal type material. The colon is nearly completely decompressed. Surgical anastomosis in the colon is seen at the hepatic flexure with the patient is status post subtotal right colectomy. Obstruction is not related to the surgical anastomosis. There is a small volume of abdominal and pelvic ascites.  A 0.3 cm hypo attenuating lesion in the dome the liver on image 13 is identified. The liver is otherwise unremarkable. Bilateral renal cysts are unchanged. The adrenal glands, spleen, pancreas and gallbladder are  unremarkable. No lymphadenopathy is seen. There is no focal fluid collection. No lytic or sclerotic bony lesion is seen.  IMPRESSION: Study is positive for small bowel obstruction. The obstruction is likely due to adhesions with a transition point identified in the right lower quadrant. No mass is seen.  Small volume of abdominal and pelvic ascites likely related to obstruction.  0.3 cm in diameter hypoattenuating lesion in the dome the liver appears new since the prior study. It cannot be definitively characterized but likely represents a cyst. Recommend attention on follow-up examinations.  Status post subtotal right hemicolectomy.   Electronically Signed   By: Inge Rise M.D.   On: 02/20/2014 07:53   Dg Abd 2 Views  02/23/2014   CLINICAL DATA:  Abdominal pain, small-bowel obstruction  EXAM: ABDOMEN - 2 VIEW  COMPARISON:  02/22/2014  FINDINGS: There is gas and stool throughout the colon rectosigmoid colon. No dilated loops of large or small bowel. No free air beneath hemidiaphragms on upright exam. There is small right effusion  IMPRESSION: No evidence of bowel obstruction.  Small right effusion.   Electronically Signed   By: Suzy Bouchard M.D.   On: 02/23/2014 09:30   Dg Abd 2 Views  02/22/2014   CLINICAL DATA:  Recent abdominal pain, short of breath  EXAM: ABDOMEN - 2 VIEW  COMPARISON:  Abdominal film 02/21/2014  FINDINGS: NG tube extends to the stomach. There is interval decrease in the number of dilated loops of small bowel. Additionally the diameter of the loops of bowel is decreased. There is gas and stool in the rectum. Surgical clips in the right abdomen.  IMPRESSION: Improvement in small bowel obstruction pattern.   Electronically Signed   By: Suzy Bouchard M.D.   On: 02/22/2014 11:36   Dg Abd 2 Views  02/21/2014   CLINICAL DATA:  80 year old male with small bowel obstruction and enteric tube placement. Initial encounter.  EXAM: ABDOMEN - 2 VIEW  COMPARISON:  Radiographs and CT  Abdomen and Pelvis 02/20/2014 and earlier.  FINDINGS: Upright and supine radiographs.  Enteric tube in place, tip at the gastric antrum. Side hole at the gastric body.  Possible small right pleural effusion. No pneumoperitoneum on today upright images.  Gas-filled small bowel loops measuring up to 3 cm diameter. Small volume oral contrast now suspected in the left colon. Colonic gas has mildly increased since the most recent radiographs. Staple lines re - identified in the right abdomen. Small volume excreted IV contrast in the bladder. No acute osseous abnormality identified.  IMPRESSION: 1. Similar bowel gas pattern since the recent CT Abdomen and Pelvis, compatible with partial small bowel obstruction. No free air. 2. Enteric tube in place, side hole the gastric body. 3. Suggestion of small right pleural effusion.   Electronically Signed   By: Lars Pinks M.D.   On: 02/21/2014 08:08   Dg Abd Portable 1v  02/20/2014   CLINICAL DATA:  NG tube placement  EXAM: PORTABLE ABDOMEN - 1 VIEW  COMPARISON:  None.  FINDINGS: There is nonspecific nonobstructive bowel gas pattern. NG tube with tip in proximal duodenum.  IMPRESSION: NG tube in place.   Electronically Signed   By: Lahoma Crocker M.D.   On: 02/20/2014 12:42    [employer]   [facility] Pager:[phone]  If 7PM-7AM, please contact night-coverage [email or web address] Password [email or web address] 02/24/2014, 9:44 AM   LOS: 4 days   **Disclaimer: This note may have been dictated with voice recognition software. Similar sounding words can inadvertently be transcribed and this note may contain transcription errors which may not have been corrected upon publication of note.**  Attending Patient was seen, examined,treatment plan was discussed with the Physician extender. I have directly reviewed the clinical findings, lab, imaging studies and management of this patient in detail. I have made the necessary changes to the above noted  documentation, and agree with the documentation, as recorded by the Physician extender.  Nena Alexander MD [facility].

## 2014-02-24 NOTE — Progress Notes (Signed)
CARE MANAGEMENT NOTE 02/24/2014  Patient:  Samuel Mann, Samuel Mann   Account Number:  [id number]  Date Initiated:  02/21/2014  Documentation initiated by:  Tomi Bamberger  Subjective/Objective Assessment:   dx sbo  admit- lives with spouse.     Action/Plan:   Anticipated DC Date:  02/23/2014   Anticipated DC Plan:  [place]  CM consult      Choice offered to / List presented to:             Status of service:  In process, will continue to follow Medicare Important Message given?  YES (If response is "NO", the following Medicare IM given date fields will be blank) Date Medicare IM given:  02/24/2014 Medicare IM given by:  Tomi Bamberger Date Additional Medicare IM given:   Additional Medicare IM given by:    Discharge Disposition:    Per UR Regulation:  Reviewed for med. necessity/level of care/duration of stay  If discussed at [place] of Stay Meetings, dates discussed:    Comments:  02/21/14 [place], BSN [phone] patient lives with spouse, npo, ng tube.  NCM will continue to follow for dc needs.

## 2014-02-25 LAB — BASIC METABOLIC PANEL
Anion gap: 9 (ref 5–15)
BUN: 13 mg/dL (ref 6–23)
CALCIUM: 8.7 mg/dL (ref 8.4–10.5)
CO2: 26 meq/L (ref 19–32)
Chloride: 108 mEq/L (ref 96–112)
Creatinine, Ser: 1.12 mg/dL (ref 0.50–1.35)
GFR calc Af Amer: 65 mL/min — ABNORMAL LOW (ref >90)
GFR, EST NON AFRICAN AMERICAN: 56 mL/min — AB (ref >90)
GLUCOSE: 95 mg/dL (ref 70–99)
Potassium: 4.6 mEq/L (ref 3.7–5.3)
SODIUM: 143 meq/L (ref 137–147)

## 2014-02-25 NOTE — Progress Notes (Signed)
Nsg Discharge Note  Admit Date:  02/20/2014 Discharge date: 02/25/2014   Kathrynn Running Pierre Sr. to be D/C'd Home per MD order.  AVS completed.  Copy for chart, and copy for patient signed, and dated. Patient/caregiver able to verbalize understanding.  Discharge Medication:   Medication List         CALTRATE 600 PLUS-VIT D PO  Take 600 mg by mouth 2 (two) times daily.     ipratropium 0.03 % nasal spray  Commonly known as:  ATROVENT  Place 2 sprays into the nose daily.     metoprolol succinate 50 MG 24 hr tablet  Commonly known as:  TOPROL-XL  Take 50 mg by mouth daily. Take with or immediately following a meal.     polyethylene glycol powder powder  Commonly known as:  GLYCOLAX/MIRALAX  Take 17 g by mouth as needed for mild constipation.     rosuvastatin 20 MG tablet  Commonly known as:  CRESTOR  Take 20 mg by mouth daily.        Discharge Assessment: Filed Vitals:   02/25/14 1004  BP: 117/75  Pulse: 96  Temp:   Resp:    Skin clean, dry and intact without evidence of skin break down, no evidence of skin tears noted. IV catheter discontinued intact. Site without signs and symptoms of complications - no redness or edema noted at insertion site, patient denies c/o pain - only slight tenderness at site.  Dressing with slight pressure applied.  D/c Instructions-Education: Discharge instructions given to patient/family with verbalized understanding. D/c education completed with patient/family including follow up instructions, medication list, d/c activities limitations if indicated, with other d/c instructions as indicated by MD - patient able to verbalize understanding, all questions fully answered. Patient instructed to return to ED, call 911, or call MD for any changes in condition.  Patient escorted via [place], and D/C home via private auto.  Quintessa Simmerman Margaretha Sheffield, RN 02/25/2014 12:36 PM

## 2014-02-25 NOTE — Progress Notes (Signed)
  Subjective: Eating a regular diet, feels much better, + BM.  Objective: Vital signs in last 24 hours: Temp:  [98.3 F (36.8 C)-98.5 F (36.9 C)] 98.5 F (36.9 C) (08/18 0519) Pulse Rate:  [69-80] 76 (08/18 0519) Resp:  [18-20] 20 (08/18 0519) BP: (121-146)/(72-78) 146/78 mmHg (08/18 0519) SpO2:  [93 %-94 %] 93 % (08/18 0519) Weight:  [70 kg (154 lb 5.2 oz)] 70 kg (154 lb 5.2 oz) (08/18 0519) Last BM Date: 02/24/14  Intake/Output from previous day: 08/17 0701 - 08/18 0700 In: 840 [P.O.:840] Out: -  Intake/Output this shift:    General appearance: alert, cooperative and no distress GI: soft, non-tender; bowel sounds normal; no masses,  no organomegaly  Lab Results:   Recent Labs  02/23/14 0555  WBC 9.7  HGB 12.1*  HCT 37.0*  PLT 161    BMET  Recent Labs  02/23/14 0555 02/25/14 0533  NA 141 143  K 4.0 4.6  CL 106 108  CO2 18* 26  GLUCOSE 73 95  BUN 26* 13  CREATININE 1.14 1.12  CALCIUM 8.1* 8.7   PT/INR No results found for this basename: LABPROT, INR,  in the last 72 hours   Recent Labs Lab 02/20/14 0445 02/21/14 0516  AST 25 26  ALT 12 11  ALKPHOS 111 71  BILITOT 0.5 0.5  PROT 7.4 5.6*  ALBUMIN 4.1 2.9*     Lipase     Component Value Date/Time   LIPASE 22 05/08/2012 0148     Studies/Results: No results found.  Medications: . antiseptic oral rinse  7 mL Mouth Rinse BID  . enoxaparin (LOVENOX) injection  40 mg Subcutaneous Q24H  . ipratropium  1 spray Each Nare Daily  . metoprolol succinate  50 mg Oral Daily    Assessment/Plan SBO  Acute renal failure - resolving  PAF  NICM with CHF/PTVP  Hypertension   Plan:  He is going home today. No need for follow up with our office.`   LOS: 5 days    Aranza Geddes 02/25/2014

## 2014-02-25 NOTE — Progress Notes (Signed)
Ready for discharge.  Imogene Burn. Georgette Dover, MD, [facility] Surgery  General/ Trauma Surgery  02/25/2014 9:42 AM

## 2014-02-25 NOTE — Discharge Summary (Signed)
PATIENT DETAILS Name: Samuel Redder Leighty Sr. Age: 80 y.o. Sex: male Date of Birth: 1922-06-15 MRN: [id number]. Admit Date: 02/20/2014 Admitting Physician: Cristal Ford, DO PPJ:[id number] DAVIDSON, MD  Recommendations for Outpatient Follow-up:  1. Please repeat CBC/BMET at next visit  PRIMARY DISCHARGE DIAGNOSIS:  Active Problems:   SBO (small bowel obstruction)   Small bowel obstruction      PAST MEDICAL HISTORY: Past Medical History  Diagnosis Date  . Hypertension   . Coronary artery disease   . Ventricular arrhythmia   . PAF (paroxysmal atrial fibrillation)   . Nonischemic cardiomyopathy   . CHF (congestive heart failure)   . Dyslipidemia   . DVT (deep venous thrombosis)     H/O  . PVC's (premature ventricular contractions)     Frequent  . Pacemaker   . Arthritis   . SBO (small bowel obstruction) 02/20/2014    DISCHARGE MEDICATIONS:   Medication List         CALTRATE 600 PLUS-VIT D PO  Take 600 mg by mouth 2 (two) times daily.     ipratropium 0.03 % nasal spray  Commonly known as:  ATROVENT  Place 2 sprays into the nose daily.     metoprolol succinate 50 MG 24 hr tablet  Commonly known as:  TOPROL-XL  Take 50 mg by mouth daily. Take with or immediately following a meal.     polyethylene glycol powder powder  Commonly known as:  GLYCOLAX/MIRALAX  Take 17 g by mouth as needed for mild constipation.     rosuvastatin 20 MG tablet  Commonly known as:  CRESTOR  Take 20 mg by mouth daily.        ALLERGIES:  No Known Allergies  BRIEF HPI:  See H&P, Labs, Consult and Test reports for all details in brief. Patient with past medical history of nonischemic cardiomyopathy, hypertension, hyperlipidemia, and pacemaker presented to the emergency department with complaints of abdominal pain, nausea, vomiting that began the day before. Discovered to have SBO which was managed with NG tube and NPO as an inpatient.    CONSULTATIONS:   general  surgery  PERTINENT RADIOLOGIC STUDIES: Ct Abdomen Pelvis W Contrast  02/20/2014   CLINICAL DATA:  Left lower quadrant pain beginning last night.  EXAM: CT ABDOMEN AND PELVIS WITH CONTRAST  TECHNIQUE: Multidetector CT imaging of the abdomen and pelvis was performed using the standard protocol following bolus administration of intravenous contrast.  CONTRAST:  100 mL OMNIPAQUE IOHEXOL 300 MG/ML  SOLN  COMPARISON:  CT abdomen and pelvis 03/08/2008.  FINDINGS: A 0.3 cm subpleural nodule in the right lung base on image 5 is unchanged. Dependent atelectasis is noted. There is no pleural or pericardial effusion. Pacing leads are noted.  Small bowel loops are mildly dilated at 3.1 cm with air fluid levels present. The stomach is distended. A transition point is identified in the right lower quadrant of the abdomen where distal ileal loops are completely decompressed. Bowel contents just proximal to this transition point contain fecal type material. The colon is nearly completely decompressed. Surgical anastomosis in the colon is seen at the hepatic flexure with the patient is status post subtotal right colectomy. Obstruction is not related to the surgical anastomosis. There is a small volume of abdominal and pelvic ascites.  A 0.3 cm hypo attenuating lesion in the dome the liver on image 13 is identified. The liver is otherwise unremarkable. Bilateral renal cysts are unchanged. The adrenal glands, spleen, pancreas and gallbladder are unremarkable. No lymphadenopathy  is seen. There is no focal fluid collection. No lytic or sclerotic bony lesion is seen.  IMPRESSION: Study is positive for small bowel obstruction. The obstruction is likely due to adhesions with a transition point identified in the right lower quadrant. No mass is seen.  Small volume of abdominal and pelvic ascites likely related to obstruction.  0.3 cm in diameter hypoattenuating lesion in the dome the liver appears new since the prior study. It cannot be  definitively characterized but likely represents a cyst. Recommend attention on follow-up examinations.  Status post subtotal right hemicolectomy.   Electronically Signed   By: Inge Rise M.D.   On: 02/20/2014 07:53   Dg Abd 2 Views  02/23/2014   CLINICAL DATA:  Abdominal pain, small-bowel obstruction  EXAM: ABDOMEN - 2 VIEW  COMPARISON:  02/22/2014  FINDINGS: There is gas and stool throughout the colon rectosigmoid colon. No dilated loops of large or small bowel. No free air beneath hemidiaphragms on upright exam. There is small right effusion  IMPRESSION: No evidence of bowel obstruction.  Small right effusion.   Electronically Signed   By: Suzy Bouchard M.D.   On: 02/23/2014 09:30   Dg Abd 2 Views  02/22/2014   CLINICAL DATA:  Recent abdominal pain, short of breath  EXAM: ABDOMEN - 2 VIEW  COMPARISON:  Abdominal film 02/21/2014  FINDINGS: NG tube extends to the stomach. There is interval decrease in the number of dilated loops of small bowel. Additionally the diameter of the loops of bowel is decreased. There is gas and stool in the rectum. Surgical clips in the right abdomen.  IMPRESSION: Improvement in small bowel obstruction pattern.   Electronically Signed   By: Suzy Bouchard M.D.   On: 02/22/2014 11:36   Dg Abd 2 Views  02/21/2014   CLINICAL DATA:  80 year old male with small bowel obstruction and enteric tube placement. Initial encounter.  EXAM: ABDOMEN - 2 VIEW  COMPARISON:  Radiographs and CT Abdomen and Pelvis 02/20/2014 and earlier.  FINDINGS: Upright and supine radiographs.  Enteric tube in place, tip at the gastric antrum. Side hole at the gastric body.  Possible small right pleural effusion. No pneumoperitoneum on today upright images.  Gas-filled small bowel loops measuring up to 3 cm diameter. Small volume oral contrast now suspected in the left colon. Colonic gas has mildly increased since the most recent radiographs. Staple lines re - identified in the right abdomen. Small  volume excreted IV contrast in the bladder. No acute osseous abnormality identified.  IMPRESSION: 1. Similar bowel gas pattern since the recent CT Abdomen and Pelvis, compatible with partial small bowel obstruction. No free air. 2. Enteric tube in place, side hole the gastric body. 3. Suggestion of small right pleural effusion.   Electronically Signed   By: Lars Pinks M.D.   On: 02/21/2014 08:08   Dg Abd Portable 1v  02/20/2014   CLINICAL DATA:  NG tube placement  EXAM: PORTABLE ABDOMEN - 1 VIEW  COMPARISON:  None.  FINDINGS: There is nonspecific nonobstructive bowel gas pattern. NG tube with tip in proximal duodenum.  IMPRESSION: NG tube in place.   Electronically Signed   By: Lahoma Crocker M.D.   On: 02/20/2014 12:42     PERTINENT LAB RESULTS: CBC:  Recent Labs  02/23/14 0555  WBC 9.7  HGB 12.1*  HCT 37.0*  PLT 161   CMET CMP     Component Value Date/Time   NA 143 02/25/2014 0533   K 4.6  02/25/2014 0533   CL 108 02/25/2014 0533   CO2 26 02/25/2014 0533   GLUCOSE 95 02/25/2014 0533   BUN 13 02/25/2014 0533   CREATININE 1.12 02/25/2014 0533   CALCIUM 8.7 02/25/2014 0533   PROT 5.6* 02/21/2014 0516   ALBUMIN 2.9* 02/21/2014 0516   AST 26 02/21/2014 0516   ALT 11 02/21/2014 0516   ALKPHOS 71 02/21/2014 0516   BILITOT 0.5 02/21/2014 0516   GFRNONAA 56* 02/25/2014 0533   GFRAA 65* 02/25/2014 [place] COURSE:      Small bowel obstruction likely secondary to adhesion  -Patient was admitted, kept n.p.o., started on IV fluids and NG tube was placed. General surgery was consulted.  -Passed flatus during hospital course, did have a small bowel movement on 8/13. X-ray abdomen on 8/15 and 8/16 showed improvement  -Seen by general surgery. Clamped NG tube on 8/15 and removed on 8/16. Started on clear liquids and then eventually advanced to soft diet this am. Abdomen is very soft and exam without any distention or tenderness. Since has toleratede diet and had BM on 8/17-stable for  discharge today  ARF  -likely pre-renal-however did contrast for his CT Abd on 8/13. UA showsedno proteinuria  -Resolved with IVF   Hypernatremia  - Resolved with 0.45 NS   Paroxysmal atrial fibrillation  -According to the patient, his pacemaker was interrogated patient has not been in atrial fibrillation for the past 2 years. - Rate controlled, stable with oral metoprolol  - Previously was on Coumadin. However per patient, his PCP, after discussion with cardiologist, discontinued approximately 2 months back. Did not restart during inpatient stay   Run of SVT  - Did have a run of narrow complex tachycardia in ER. Continued to monitor in telemetry. Patient asymptomatic.   History of nonischemic cardiomyopathy with CHF  - Echocardiogram done this admission shows improved EF at 55-60%. Clinically euvolemic. Continue to monitor cautiously.   Hyperlipidemia  -Held statin until discharge  Leukocytosis  -Likely reactive to above -WBC normalized   TODAY-DAY OF DISCHARGE:  Subjective:   Ausencio Lariviere today has no headache,no chest or abdominal pain, no new weakness tingling or numbness, feels much better and wants to go home today.  Objective:   Blood pressure 146/78, pulse 76, temperature 98.5 F (36.9 C), temperature source Oral, resp. rate 20, height 5\' 7"  (1.702 m), weight 70 kg (154 lb 5.2 oz), SpO2 93.00%.  Intake/Output Summary (Last 24 hours) at 02/25/14 0957 Last data filed at 02/24/14 1300  Gross per 24 hour  Intake    480 ml  Output      0 ml  Net    480 ml   Filed Weights   02/23/14 0432 02/24/14 0448 02/25/14 0519  Weight: 72.1 kg (158 lb 15.2 oz) 70.1 kg (154 lb 8.7 oz) 70 kg (154 lb 5.2 oz)    Exam Awake Alert, Oriented *3, No new F.N deficits, Normal affect [place].AT,PERRAL Supple Neck,No JVD, No cervical lymphadenopathy appreciated.  Symmetrical Chest wall movement, Good air movement bilaterally, CTAB RRR,No Gallops,Rubs or new Murmurs, No Parasternal  Heave +ve B.Sounds, Abd Soft, Non tender, No organomegaly appreciated, No rebound -guarding or rigidity. No Cyanosis, Clubbing or edema, No new Rash or bruise  DISCHARGE CONDITION: Stable  DISPOSITION: Home with home health services   DISCHARGE INSTRUCTIONS:    Activity:  As tolerated with Full fall precautions, use walker  Diet recommendation: Heart Healthy diet Discharge Instructions   Call MD for:  persistant nausea and vomiting    Complete by:  As directed      Diet - low sodium heart healthy    Complete by:  As directed      Increase activity slowly    Complete by:  As directed           Follow-up Information   Follow up with Horatio Pel, MD In 1 week.   Specialty:  Internal Medicine   Contact information:   [address] [place] [address] [phone]      Total Time spent on discharge equals 45 minutes.  Signed: Lazarus Gowda [facility] 02/25/2014 9:57 AM  **Disclaimer: This note may have been dictated with voice recognition software. Similar sounding words can inadvertently be transcribed and this note may contain transcription errors which may not have been corrected upon publication of note.**  Attending Patient was seen, examined,treatment plan was discussed with the Physician extender. I have directly reviewed the clinical findings, lab, imaging studies and management of this patient in detail. I have made the necessary changes to the above noted documentation, and agree with the documentation, as recorded by the Physician extender.  Nena Alexander MD [facility].

## 2014-02-27 ENCOUNTER — Other Ambulatory Visit: Payer: Self-pay | Admitting: Internal Medicine

## 2014-02-27 DIAGNOSIS — D649 Anemia, unspecified: Secondary | ICD-10-CM | POA: Diagnosis not present

## 2014-02-27 DIAGNOSIS — K7689 Other specified diseases of liver: Secondary | ICD-10-CM | POA: Diagnosis not present

## 2014-02-27 DIAGNOSIS — I10 Essential (primary) hypertension: Secondary | ICD-10-CM | POA: Diagnosis not present

## 2014-02-27 DIAGNOSIS — I428 Other cardiomyopathies: Secondary | ICD-10-CM | POA: Diagnosis not present

## 2014-03-18 ENCOUNTER — Other Ambulatory Visit: Payer: Self-pay | Admitting: Cardiovascular Disease

## 2014-03-18 NOTE — Telephone Encounter (Signed)
Rx was sent to pharmacy electronically. 

## 2014-04-25 DIAGNOSIS — Z23 Encounter for immunization: Secondary | ICD-10-CM | POA: Diagnosis not present

## 2014-05-01 ENCOUNTER — Telehealth: Payer: Self-pay | Admitting: Cardiology

## 2014-05-01 ENCOUNTER — Encounter: Payer: Self-pay | Admitting: Cardiovascular Disease

## 2014-05-01 ENCOUNTER — Ambulatory Visit (INDEPENDENT_AMBULATORY_CARE_PROVIDER_SITE_OTHER): Payer: Medicare Other | Admitting: *Deleted

## 2014-05-01 DIAGNOSIS — I4891 Unspecified atrial fibrillation: Secondary | ICD-10-CM

## 2014-05-01 NOTE — Progress Notes (Signed)
Remote pacemaker transmission.   

## 2014-05-01 NOTE — Telephone Encounter (Signed)
Spoke with pt and reminded pt of remote transmission that is due today. Pt verbalized understanding.   

## 2014-05-05 LAB — MDC_IDC_ENUM_SESS_TYPE_REMOTE
Battery Impedance: 207 Ohm
Battery Remaining Longevity: 128 mo
Brady Statistic AP VP Percent: 1 %
Brady Statistic AS VP Percent: 0 %
Date Time Interrogation Session: 20151022160715
Lead Channel Impedance Value: 436 Ohm
Lead Channel Pacing Threshold Amplitude: 0.75 V
Lead Channel Pacing Threshold Amplitude: 0.75 V
Lead Channel Pacing Threshold Pulse Width: 0.4 ms
Lead Channel Sensing Intrinsic Amplitude: 8 mV
Lead Channel Setting Pacing Amplitude: 1.5 V
Lead Channel Setting Sensing Sensitivity: 4 mV
MDC IDC MSMT BATTERY VOLTAGE: 2.8 V
MDC IDC MSMT LEADCHNL RV IMPEDANCE VALUE: 654 Ohm
MDC IDC MSMT LEADCHNL RV PACING THRESHOLD PULSEWIDTH: 0.4 ms
MDC IDC SET LEADCHNL RV PACING AMPLITUDE: 2 V
MDC IDC SET LEADCHNL RV PACING PULSEWIDTH: 0.4 ms
MDC IDC STAT BRADY AP VS PERCENT: 96 %
MDC IDC STAT BRADY AS VS PERCENT: 3 %

## 2014-05-08 ENCOUNTER — Encounter: Payer: Self-pay | Admitting: Cardiology

## 2014-05-15 DIAGNOSIS — L570 Actinic keratosis: Secondary | ICD-10-CM | POA: Diagnosis not present

## 2014-05-15 DIAGNOSIS — D692 Other nonthrombocytopenic purpura: Secondary | ICD-10-CM | POA: Diagnosis not present

## 2014-05-15 DIAGNOSIS — L821 Other seborrheic keratosis: Secondary | ICD-10-CM | POA: Diagnosis not present

## 2014-05-15 DIAGNOSIS — D1801 Hemangioma of skin and subcutaneous tissue: Secondary | ICD-10-CM | POA: Diagnosis not present

## 2014-05-15 DIAGNOSIS — Z85828 Personal history of other malignant neoplasm of skin: Secondary | ICD-10-CM | POA: Diagnosis not present

## 2014-05-22 DIAGNOSIS — Z23 Encounter for immunization: Secondary | ICD-10-CM | POA: Diagnosis not present

## 2014-07-23 ENCOUNTER — Other Ambulatory Visit: Payer: Self-pay

## 2014-07-23 NOTE — Telephone Encounter (Signed)
Rx sent to pharmacy   

## 2014-08-02 ENCOUNTER — Encounter: Payer: Self-pay | Admitting: Cardiovascular Disease

## 2014-08-04 ENCOUNTER — Ambulatory Visit (INDEPENDENT_AMBULATORY_CARE_PROVIDER_SITE_OTHER): Payer: Medicare Other | Admitting: *Deleted

## 2014-08-04 DIAGNOSIS — I4891 Unspecified atrial fibrillation: Secondary | ICD-10-CM | POA: Diagnosis not present

## 2014-08-04 NOTE — Progress Notes (Signed)
Remote pacemaker transmission.   

## 2014-08-10 LAB — MDC_IDC_ENUM_SESS_TYPE_REMOTE
Battery Impedance: 207 Ohm
Battery Remaining Longevity: 128 mo
Brady Statistic AP VP Percent: 1 %
Brady Statistic AP VS Percent: 96 %
Brady Statistic AS VP Percent: 0 %
Lead Channel Impedance Value: 424 Ohm
Lead Channel Impedance Value: 707 Ohm
Lead Channel Pacing Threshold Amplitude: 0.625 V
Lead Channel Pacing Threshold Amplitude: 0.75 V
Lead Channel Sensing Intrinsic Amplitude: 22.4 mV
Lead Channel Setting Pacing Amplitude: 2 V
Lead Channel Setting Sensing Sensitivity: 4 mV
MDC IDC MSMT BATTERY VOLTAGE: 2.8 V
MDC IDC MSMT LEADCHNL RA PACING THRESHOLD PULSEWIDTH: 0.4 ms
MDC IDC MSMT LEADCHNL RV PACING THRESHOLD PULSEWIDTH: 0.4 ms
MDC IDC SESS DTM: 20160123143214
MDC IDC SET LEADCHNL RA PACING AMPLITUDE: 1.5 V
MDC IDC SET LEADCHNL RV PACING PULSEWIDTH: 0.4 ms
MDC IDC STAT BRADY AS VS PERCENT: 3 %

## 2014-08-20 ENCOUNTER — Encounter: Payer: Self-pay | Admitting: Cardiology

## 2014-10-22 ENCOUNTER — Telehealth: Payer: Self-pay | Admitting: Cardiovascular Disease

## 2014-10-22 NOTE — Telephone Encounter (Signed)
Returned call to patient's wife.She stated patient is scheduled to have a tooth pulled next week, wanting to know if he needs to hold any of his medication.Stated he no longer takes coumadin.Advised he does not need to hold any of his medications.Message sent to Dr.Croitoru for review.

## 2014-10-22 NOTE — Telephone Encounter (Signed)
Pt is going to have a tooth extracted next week,need to know what to do about his medicine.

## 2014-10-23 NOTE — Telephone Encounter (Signed)
No need to hold meds

## 2014-11-04 IMAGING — CT CT ABD-PELV W/ CM
2 of 5 series · 10 of 46 positions shown, 11 images · IV contrast (Iodine)
Comparison: CT abdomen and pelvis 03/08/2008.

CLINICAL DATA: Left lower quadrant pain beginning last night.

EXAM:
CT ABDOMEN AND PELVIS WITH CONTRAST
TECHNIQUE: Multidetector CT imaging of the abdomen and pelvis was performed
using the standard protocol following bolus administration of
intravenous contrast.
CONTRAST:  100 mL OMNIPAQUE IOHEXOL 300 MG/ML  SOLN

[Series 201: routine, idose (2) · axial · 0.78mm/px · z∈[+582,+952]mm · 7 of 96 slices shown, 8 images]
[im 11/96  soft-tissue]
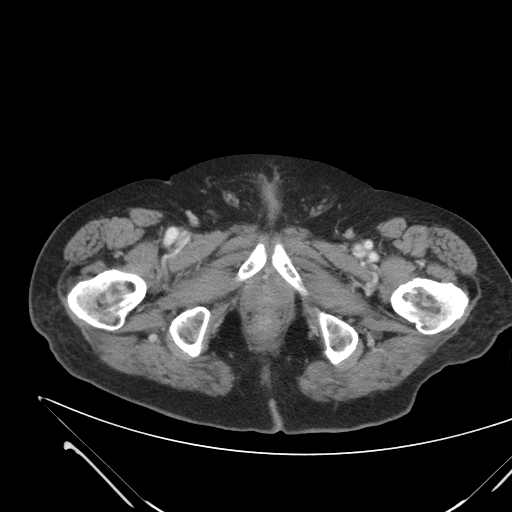
[im 11/96  bone]
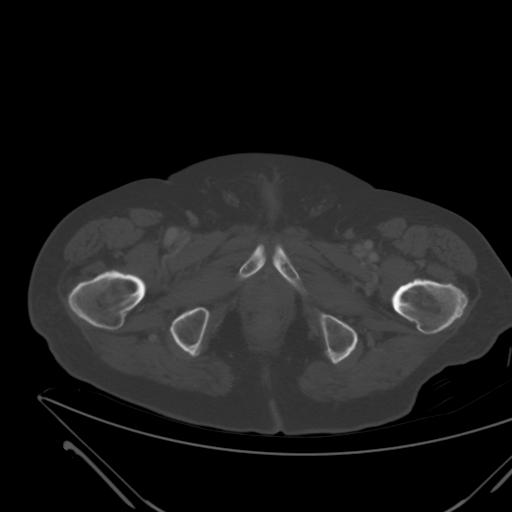
[im 22/96  soft-tissue]
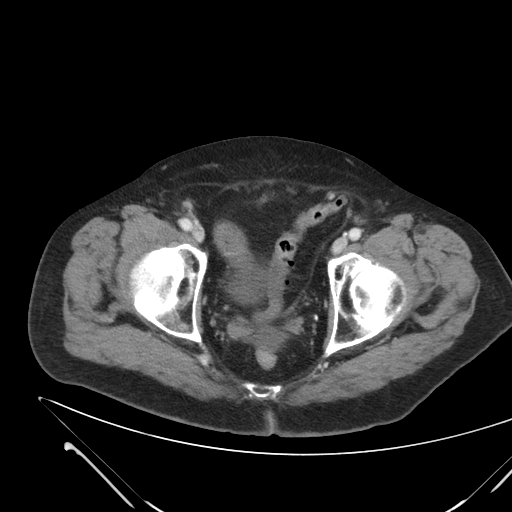
[im 37/96  soft-tissue]
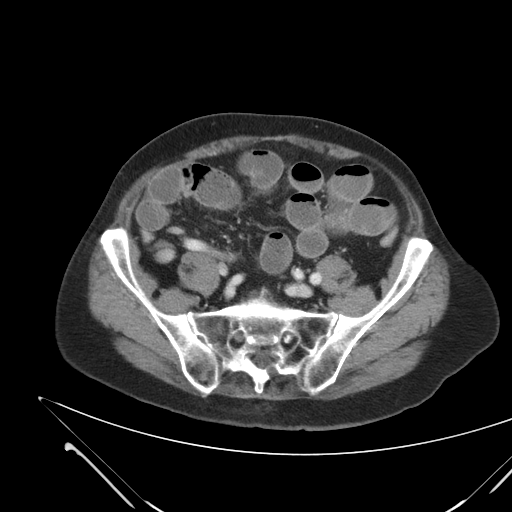
[im 48/96  soft-tissue]
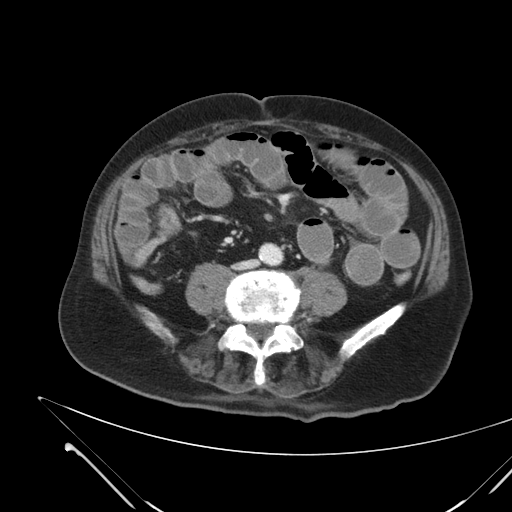
[im 59/96  soft-tissue]
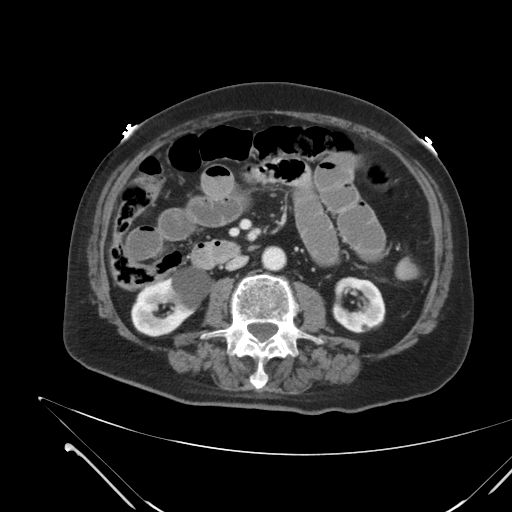
[im 74/96  soft-tissue]
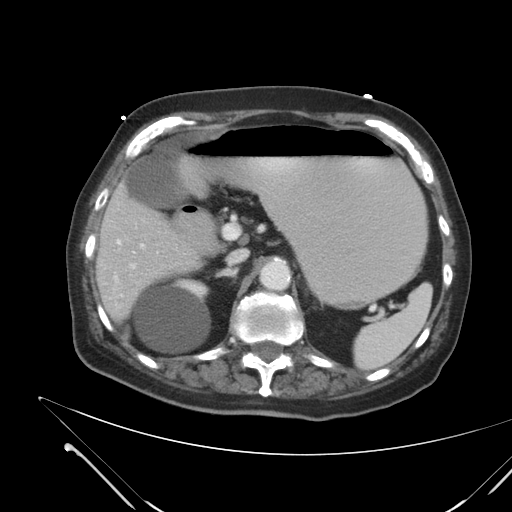
[im 85/96  soft-tissue]
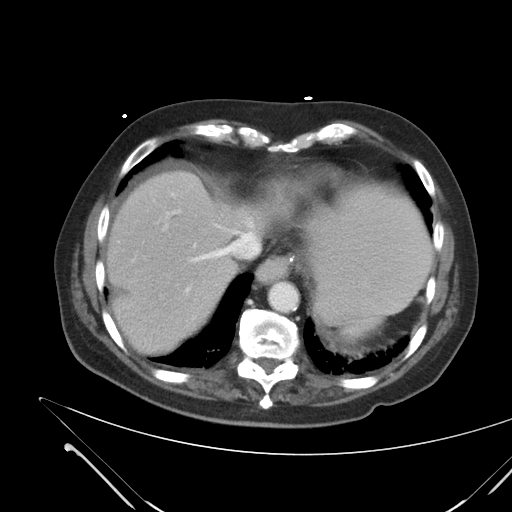

[Series 203: coronals, idose (2) · coronal · 0.45mm/px · 3 of 129 slices shown]
[im 43/129  soft-tissue]
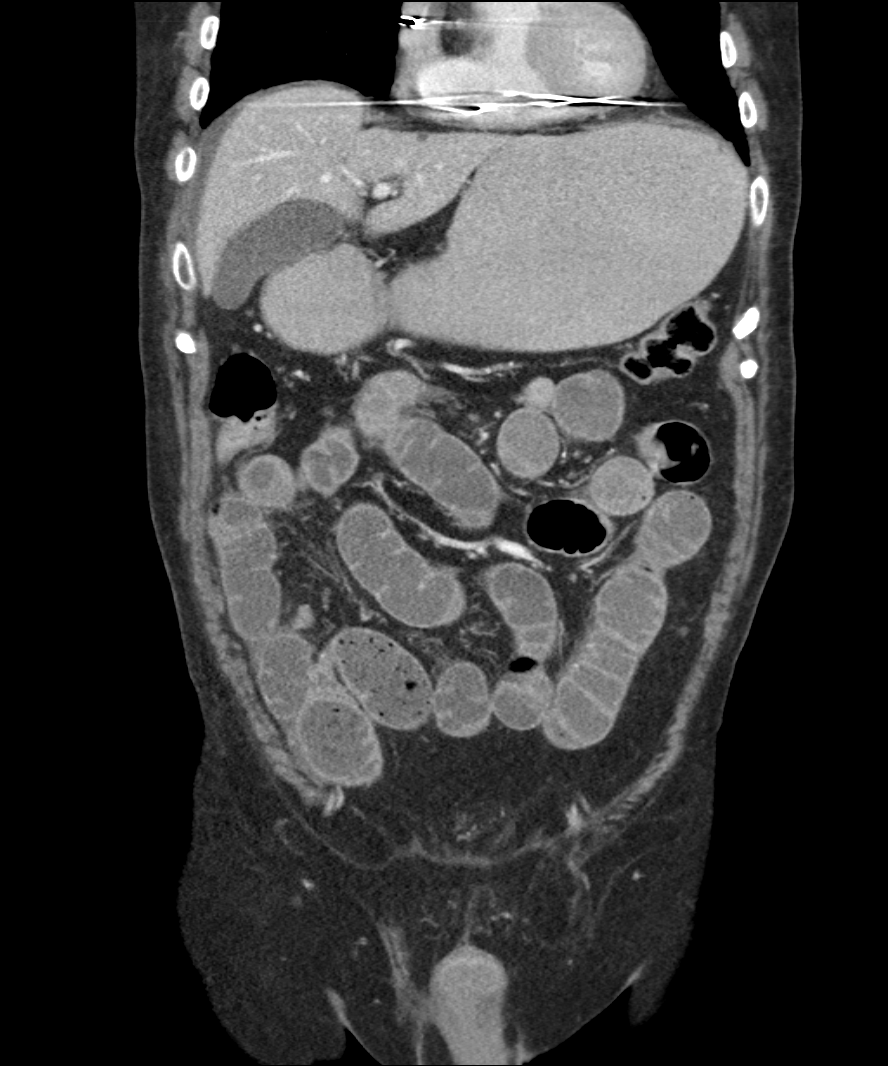
[im 57/129  soft-tissue]
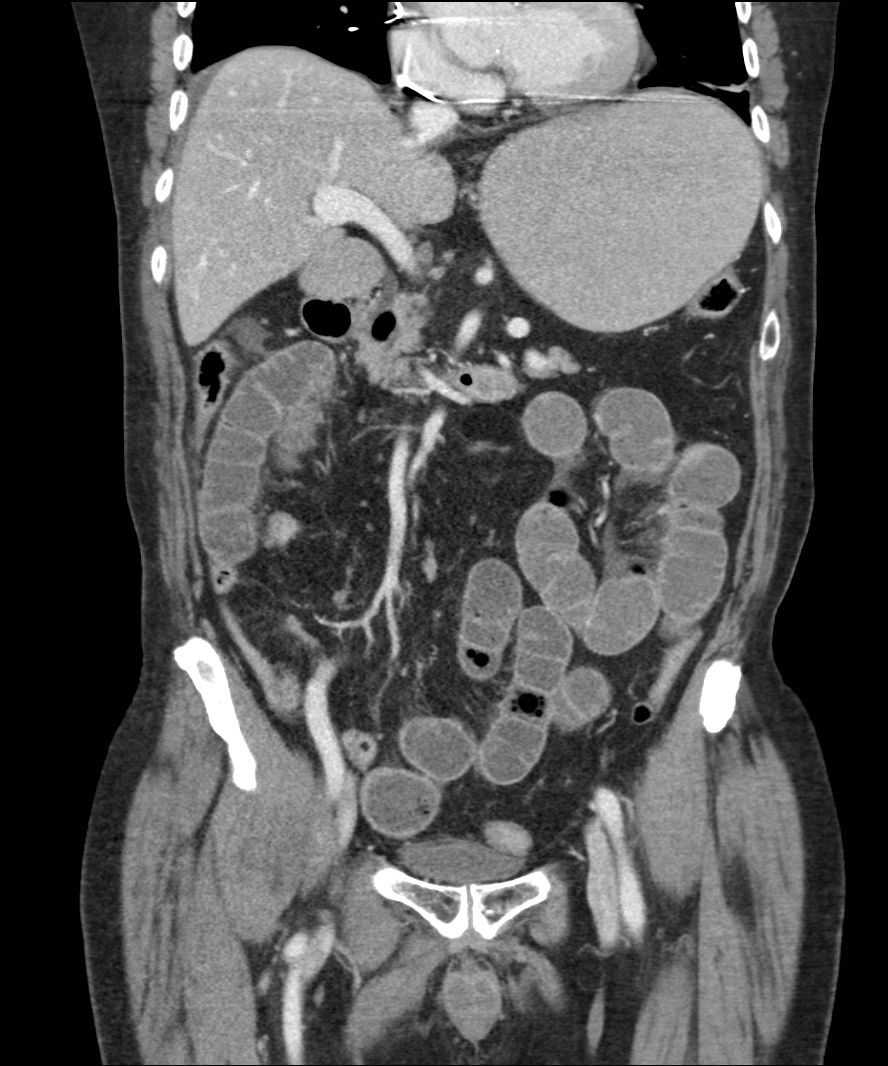
[im 72/129  soft-tissue]
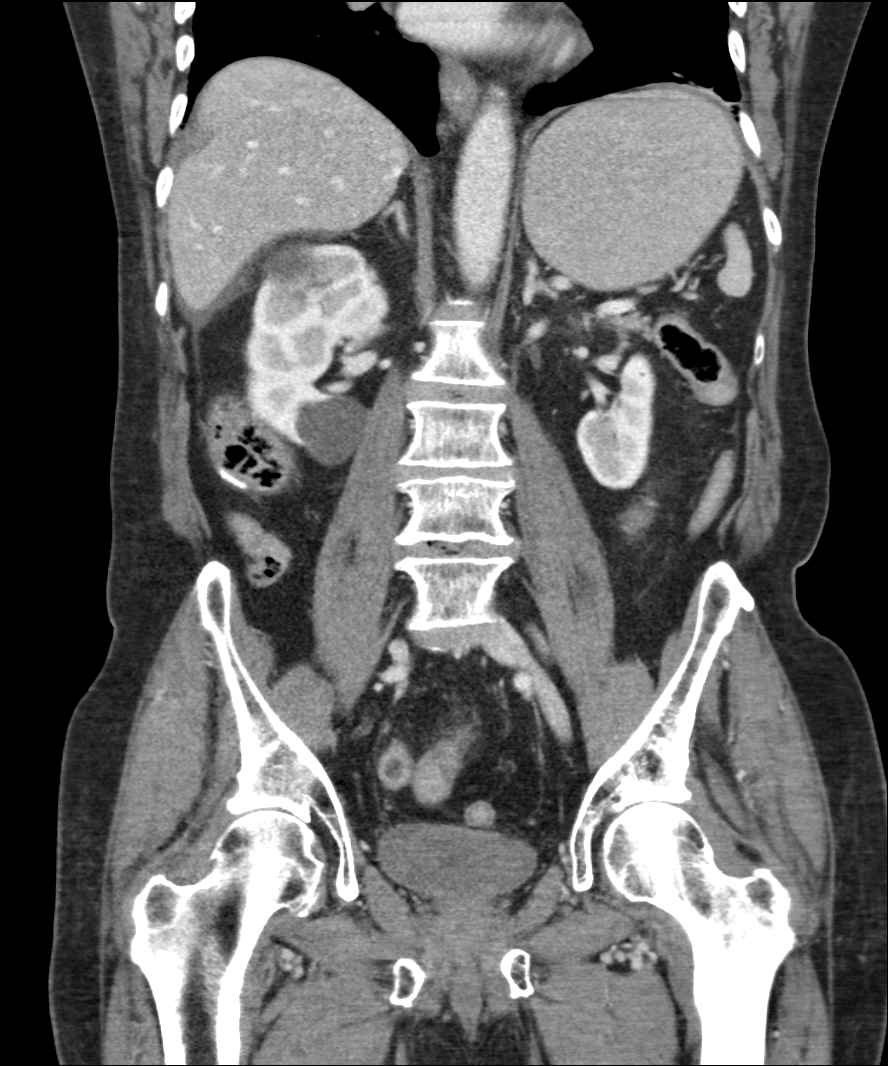

[10 of 46 positions shown; findings below may reference images not displayed]

FINDINGS: A 0.3 cm subpleural nodule in the right lung base on image 5 is
unchanged. Dependent atelectasis is noted. There is no pleural or
pericardial effusion. Pacing leads are noted.

Small bowel loops are mildly dilated at 3.1 cm with air fluid levels
present. The stomach is distended. A transition point is identified
in the right lower quadrant of the abdomen where distal ileal loops
are completely decompressed. Bowel contents just proximal to this
transition point contain fecal type material. The colon is nearly
completely decompressed. Surgical anastomosis in the colon is seen
at the hepatic flexure with the patient is status post subtotal
right colectomy. Obstruction is not related to the surgical
anastomosis. There is a small volume of abdominal and pelvic
ascites.

A 0.3 cm hypo attenuating lesion in the dome the liver on image 13
is identified. The liver is otherwise unremarkable. Bilateral renal
cysts are unchanged. The adrenal glands, spleen, pancreas and
gallbladder are unremarkable. No lymphadenopathy is seen. There is
no focal fluid collection. No lytic or sclerotic bony lesion is
seen.
IMPRESSION: Study is positive for small bowel obstruction. The obstruction is
likely due to adhesions with a transition point identified in the
right lower quadrant. No mass is seen.

Small volume of abdominal and pelvic ascites likely related to
obstruction.

0.3 cm in diameter hypoattenuating lesion in the dome the liver
appears new since the prior study. It cannot be definitively
characterized but likely represents a cyst. Recommend attention on
follow-up examinations.

Status post subtotal right hemicolectomy.

## 2014-11-04 IMAGING — CR DG ABD PORTABLE 1V
1 series · 1 of 1 positions shown · non-contrast
Comparison: None.

CLINICAL DATA: NG tube placement

EXAM:
PORTABLE ABDOMEN - 1 VIEW

[AP]
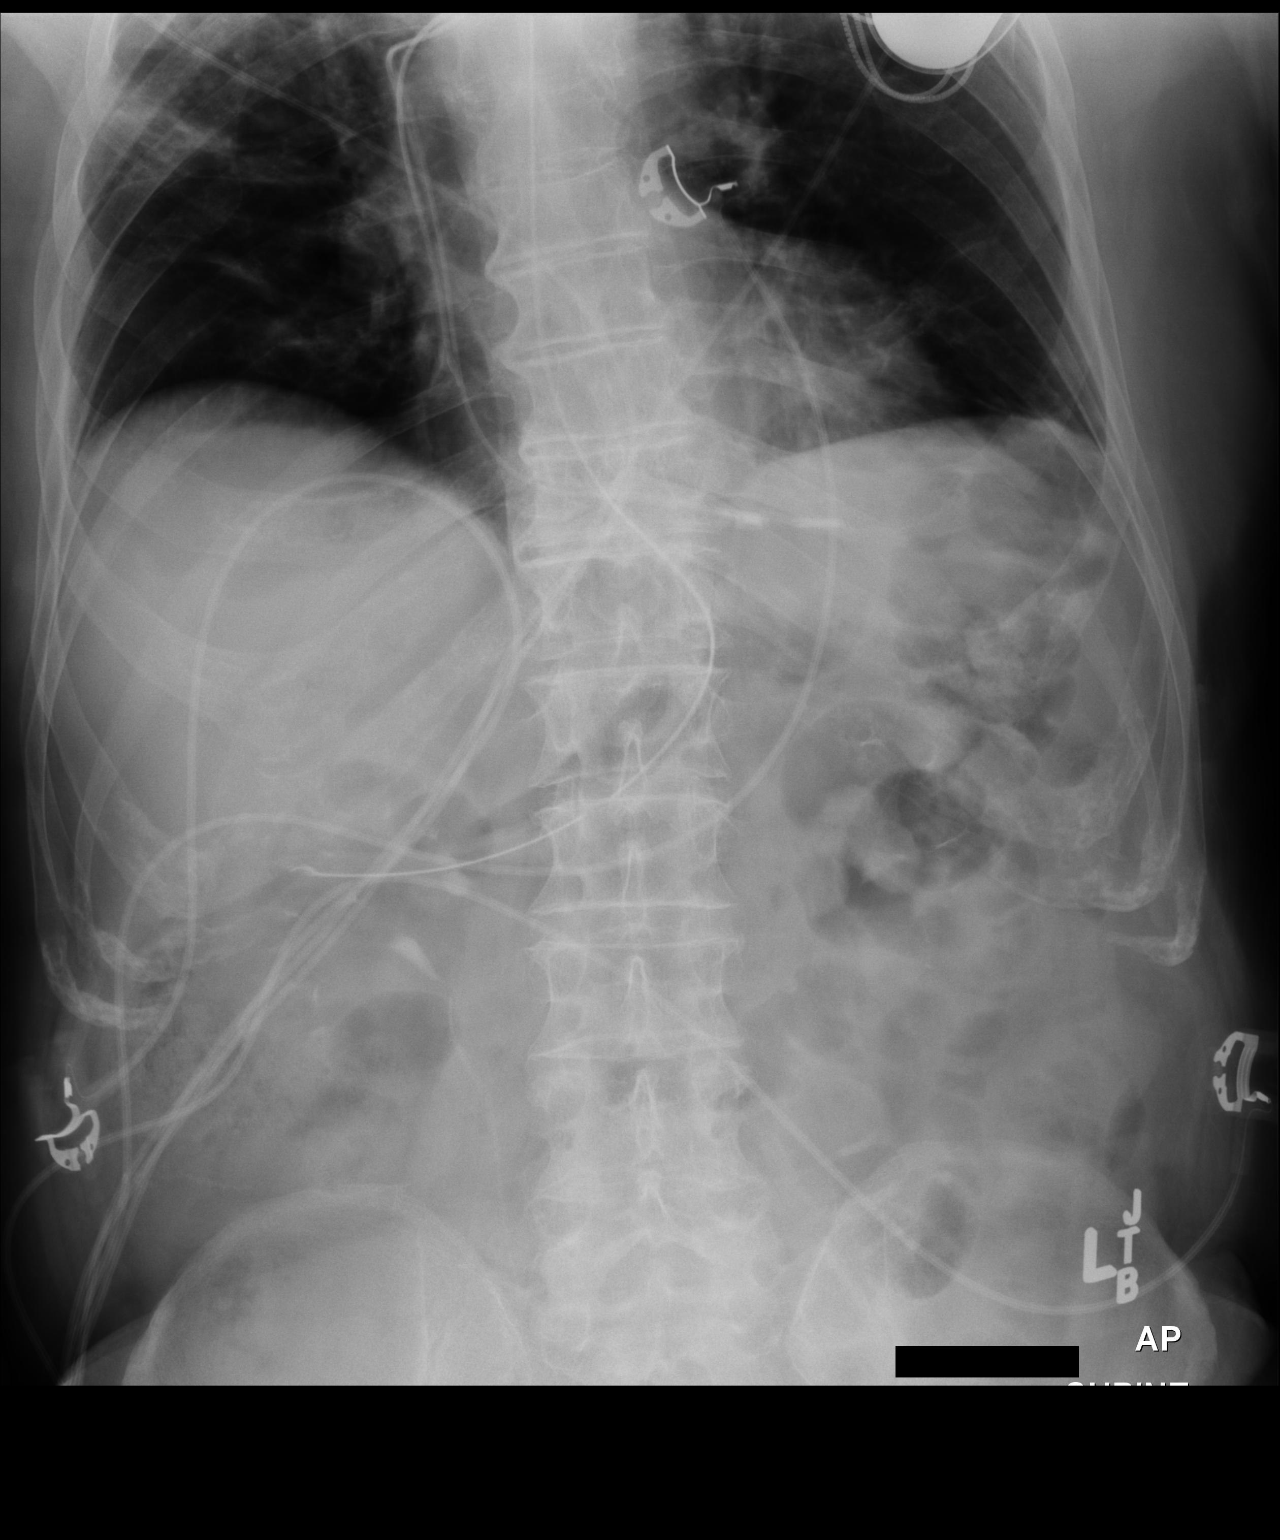

[1 of 1 positions shown; findings below may reference images not displayed]

FINDINGS: There is nonspecific nonobstructive bowel gas pattern. NG tube with
tip in proximal duodenum.
IMPRESSION: NG tube in place.

## 2014-11-18 ENCOUNTER — Ambulatory Visit
Admission: RE | Admit: 2014-11-18 | Discharge: 2014-11-18 | Disposition: A | Discharge status: Home / Self Care | Payer: Medicare Other | Source: Ambulatory Visit | Attending: Cardiovascular Disease | Admitting: Cardiovascular Disease

## 2014-11-18 ENCOUNTER — Ambulatory Visit (INDEPENDENT_AMBULATORY_CARE_PROVIDER_SITE_OTHER): Payer: Medicare Other | Admitting: Cardiovascular Disease

## 2014-11-18 ENCOUNTER — Encounter: Payer: Self-pay | Admitting: Cardiovascular Disease

## 2014-11-18 VITALS — BP 120/82 | HR 81 | Ht 66.0 in | Wt 163.7 lb

## 2014-11-18 DIAGNOSIS — R0602 Shortness of breath: Secondary | ICD-10-CM

## 2014-11-18 DIAGNOSIS — Z950 Presence of cardiac pacemaker: Secondary | ICD-10-CM | POA: Diagnosis not present

## 2014-11-18 DIAGNOSIS — E785 Hyperlipidemia, unspecified: Secondary | ICD-10-CM

## 2014-11-18 DIAGNOSIS — R05 Cough: Secondary | ICD-10-CM | POA: Diagnosis not present

## 2014-11-18 DIAGNOSIS — I4891 Unspecified atrial fibrillation: Secondary | ICD-10-CM

## 2014-11-18 DIAGNOSIS — I429 Cardiomyopathy, unspecified: Secondary | ICD-10-CM | POA: Diagnosis not present

## 2014-11-18 DIAGNOSIS — I428 Other cardiomyopathies: Secondary | ICD-10-CM

## 2014-11-18 MED ORDER — METOPROLOL SUCCINATE ER 50 MG PO TB24: 50.0000 mg | ORAL_TABLET | Freq: Every day | ORAL | Status: DC

## 2014-11-18 NOTE — Progress Notes (Signed)
Patient ID: Samuel EHLE Sr., male   DOB: 03-20-1922, 81 y.o.   MRN: [id number]     Cardiology Office Note   Date:  11/18/2014   ID:  Samuel Running Gernert Sr., DOB Apr 19, 1922, MRN [id number]  PCP:  Horatio Pel, MD  Cardiologist:   Sanda Klein, MD   Chief Complaint  Patient presents with  . Annual Exam    patient reports shortness of breath on exertion      History of Present Illness: Samuel TOZZI Sr. is a 81 y.o. 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 In 2004 coronary angiography showed noncritical CAD. He has had paroxysmal atrial fibrillation and had a dual chamber permanent pacemaker implanted in 2011 for symptomatic sinus bradycardia. Also has a remote history of lower extremity DVT. He was on chronic warfarin anticoagulation without bleeding complications or history of stroke or other embolic events, but about 2 years ago anticoagulation was stopped due to frailty, advanced age and concern for fall risk. He had brief PAib during a hospitalization for small bowel obstruction last August, none since.  Today he is here for an annual followup and has no complaints of chest pain, edema, palpitations, syncope or dizziness. He has chronic class II shortness of breath, unchanged from his usual pattern. On exam he has crackles in the right lung base.  Interrogation of his pacemaker today shows normal device function. His presenting rhythm is atrial paced ventricular sensed and atrial pacing occurs 97.7% of the time. Ventricular pacing is rare, 0.8% of the time. No episodes of atrial fibrillation have been reported since his last August, although a few brief episodes of atrial tachycardia up to 175 beats per minute (maximum 1-1/2 minutes) have been detected. He has also had brief episodes of  nonsustained ventricular tachycardia. He has not tolerated higher doses of beta blockers.    Past Medical History  Diagnosis Date  . Hypertension   . Coronary artery disease   . Ventricular arrhythmia   . PAF (paroxysmal atrial fibrillation)   . Nonischemic cardiomyopathy   . CHF (congestive heart failure)   . Dyslipidemia   . DVT (deep venous thrombosis)     H/O  . PVC's (premature ventricular contractions)     Frequent  . Pacemaker   . Arthritis   . SBO (small bowel obstruction) 02/20/2014    Past Surgical History  Procedure Laterality Date  . A-v cardiac pacemaker insertion  02/23/2010    Medtronic  . Cardiac catheterization  04/21/2003    noncritical CAD  . US echocardiography  03/01/2011    pacer induced LBBB,mod. asymmetric LVH, ER 40-45%,mild AI,mild aortic root dilatation, aortic root sclerosis/ca+.  . Nm myocar perf wall motion  02/12/2010    normal  . Abdominal surgery  2009     Current Outpatient Prescriptions  Medication Sig Dispense Refill  . Calcium-Vitamin D (CALTRATE 600 PLUS-VIT D PO) Take 600 mg by mouth 2 (two) times daily.    Marland Kitchen ipratropium (ATROVENT) 0.03 % nasal spray Place 2 sprays into the nose daily.    . metoprolol succinate (TOPROL-XL) 50 MG 24 hr tablet Take 1 tablet (50 mg total) by mouth daily. 90 tablet 3  . polyethylene glycol powder (GLYCOLAX/MIRALAX) powder Take 17 g by mouth as needed for mild constipation.     . rosuvastatin (CRESTOR) 20 MG  tablet Take 20 mg by mouth daily.     No current facility-administered medications for this visit.    Allergies:   Review of patient's allergies indicates no known allergies.    Social History:  The patient  reports that he has never smoked. He has never used smokeless tobacco. He reports that he does not drink alcohol or use illicit drugs.   Family History:  The patient's family history includes Breast cancer in his sister; CVA in his father; Cancer in his brother; Leukemia in his mother.     ROS:  Please see the history of present illness.    The patient specifically denies any chest pain at rest or with exertion, dyspnea at rest or with light exertion, orthopnea, paroxysmal nocturnal dyspnea, syncope, palpitations, focal neurological deficits, intermittent claudication, lower extremity edema, unexplained weight gain, cough, hemoptysis or wheezing.  The patient also denies abdominal pain, nausea, vomiting, dysphagia, diarrhea, constipation, polyuria, polydipsia, dysuria, hematuria, frequency, urgency, abnormal bleeding or bruising, fever, chills, unexpected weight changes, mood swings, change in skin or hair texture, change in voice quality, auditory or visual problems, allergic reactions or rashes, new musculoskeletal complaints other than usual "aches and pains".  All other systems are reviewed and negative.    PHYSICAL EXAM: VS:  BP 120/82 mmHg  Pulse 81  Ht 5\' 6"  (1.676 m)  Wt 163 lb 11.2 oz (74.254 kg)  BMI 26.43 kg/m2 , BMI Body mass index is 26.43 kg/(m^2).  General: Alert, oriented x3, no distress Head: no evidence of trauma, PERRL, EOMI, no exophtalmos or lid lag, no myxedema, no xanthelasma; normal ears, nose and oropharynx Neck: normal jugular venous pulsations and no hepatojugular reflux; brisk carotid pulses without delay and no carotid bruits Chest: clear to auscultation on left, distinct crackle in lower 1/2 of right lung that do not clear with cough, no signs of consolidation by percussion or palpation, normal fremitus, symmetrical and full respiratory excursions Cardiovascular: normal position and quality of the apical impulse, regular rhythm, normal first and second heart sounds, no murmurs, rubs or gallops Abdomen: no tenderness or distention, no masses by palpation, no abnormal pulsatility or arterial bruits, normal bowel sounds, no hepatosplenomegaly Extremities: no clubbing, cyanosis or edema; 2+ radial, ulnar and brachial pulses bilaterally; 2+ right  femoral, posterior tibial and dorsalis pedis pulses; 2+ left femoral, posterior tibial and dorsalis pedis pulses; no subclavian or femoral bruits Neurological: grossly nonfocal Psych: euthymic mood, full affect   EKG:  EKG is ordered today. The ekg ordered today demonstrates A paced, V sensed, no PVCs, normal tracing   Recent Labs: 02/20/2014: TSH 5.420* 02/21/2014: ALT 11 02/23/2014: Hemoglobin 12.1*; Magnesium 2.2; Platelets 161 02/25/2014: BUN 13; Creatinine 1.12; Potassium 4.6; Sodium 143    Lipid Panel No results found for: CHOL, TRIG, HDL, CHOLHDL, VLDL, LDLCALC, LDLDIRECT    Wt Readings from Last 3 Encounters:  11/18/14 163 lb 11.2 oz (74.254 kg)  02/25/14 154 lb 5.2 oz (70 kg)  10/25/13 168 lb 1.6 oz (76.25 kg)     ASSESSMENT AND PLAN:  Atrial fibrillation No history of embolic events. No longer anticoagulated (also has a history of leg a DVT). No events in the last several months, after the episode triggerred by small bowel obstruction 9 months ago.  Nonischemic cardiomyopathy - possibly PVC related PVC burden has substantially diminished and LVEF has normalized. He has no clinical manifestations of congestive heart failure, other than NYHA class I-II dyspnea. Has not required diuretic therapy.  Abnormal lung exam Crackles  in right lung, without fever, cough, chest pain, edema or worsening dyspnea. Do not clear with cough. CXR rports "scarring", no CHF or pneumonia.  Pacemaker - dual chamber Medtronic 2011 Normal device function. Ventricular pacing is appropriately low. No change in the device settings today.  Hyperlipidemia On treatment with statin, labs usually monitored through Dr.Pharr's office.   Current medicines are reviewed at length with the patient today.  The patient does not have concerns regarding medicines.  The following changes have been made:  no change  Labs/ tests ordered today include:  Orders Placed This Encounter  Procedures  . DG Chest 2  View  . EKG 12-Lead    Patient Instructions  A chest x-ray takes a picture of the organs and structures inside the chest, including the heart, lungs, and blood vessels. This test can show several things, including, whether the heart is enlarges; whether fluid is building up in the lungs; and whether pacemaker / defibrillator leads are still in place.  [employer] 1st Floor.   Remote monitoring is used to monitor your Pacemaker or ICD from home. This monitoring reduces the number of office visits required to check your device to one time per year. It allows [place] to monitor the functioning of your device to ensure it is working properly. You are scheduled for a device check from home on AUGUST 11,2016. You may send your transmission at any time that day. If you have a wireless device, the transmission will be sent automatically. After your physician reviews your transmission, you will receive a postcard with your next transmission date.  Dr. Sallyanne Kuster recommends that you schedule a follow-up appointment in: One Year       Signed, Trysten Bernard, MD  11/18/2014 3:48 PM    Sanda Klein, MD, [facility] [phone] office [phone] pager

## 2014-11-18 NOTE — Patient Instructions (Addendum)
A chest x-ray takes a picture of the organs and structures inside the chest, including the heart, lungs, and blood vessels. This test can show several things, including, whether the heart is enlarges; whether fluid is building up in the lungs; and whether pacemaker / defibrillator leads are still in place.  [employer] 1st Floor.   Remote monitoring is used to monitor your Pacemaker or ICD from home. This monitoring reduces the number of office visits required to check your device to one time per year. It allows [place] to monitor the functioning of your device to ensure it is working properly. You are scheduled for a device check from home on AUGUST 11,2016. You may send your transmission at any time that day. If you have a wireless device, the transmission will be sent automatically. After your physician reviews your transmission, you will receive a postcard with your next transmission date.  Dr. Sallyanne Kuster recommends that you schedule a follow-up appointment in: One Year

## 2014-11-19 LAB — CUP PACEART INCLINIC DEVICE CHECK
Battery Remaining Longevity: 124 mo
Battery Voltage: 2.79 V
Brady Statistic AP VP Percent: 1 %
Brady Statistic AS VP Percent: 0 %
Date Time Interrogation Session: 20160510132033
Lead Channel Impedance Value: 419 Ohm
Lead Channel Pacing Threshold Amplitude: 0.75 V
Lead Channel Pacing Threshold Amplitude: 0.75 V
MDC IDC MSMT BATTERY IMPEDANCE: 231 Ohm
MDC IDC MSMT LEADCHNL RA PACING THRESHOLD PULSEWIDTH: 0.4 ms
MDC IDC MSMT LEADCHNL RV IMPEDANCE VALUE: 669 Ohm
MDC IDC MSMT LEADCHNL RV PACING THRESHOLD PULSEWIDTH: 0.4 ms
MDC IDC MSMT LEADCHNL RV SENSING INTR AMPL: 8 mV
MDC IDC SET LEADCHNL RA PACING AMPLITUDE: 1.5 V
MDC IDC SET LEADCHNL RV PACING AMPLITUDE: 2 V
MDC IDC SET LEADCHNL RV PACING PULSEWIDTH: 0.4 ms
MDC IDC SET LEADCHNL RV SENSING SENSITIVITY: 4 mV
MDC IDC STAT BRADY AP VS PERCENT: 97 %
MDC IDC STAT BRADY AS VS PERCENT: 2 %

## 2014-11-28 ENCOUNTER — Ambulatory Visit
Admission: RE | Admit: 2014-11-28 | Discharge: 2014-11-28 | Disposition: A | Discharge status: Home / Self Care | Payer: Medicare Other | Source: Ambulatory Visit | Attending: Internal Medicine | Admitting: Internal Medicine

## 2014-11-28 DIAGNOSIS — K7689 Other specified diseases of liver: Secondary | ICD-10-CM

## 2014-11-28 DIAGNOSIS — N281 Cyst of kidney, acquired: Secondary | ICD-10-CM | POA: Diagnosis not present

## 2014-12-17 ENCOUNTER — Encounter: Payer: Self-pay | Admitting: Cardiovascular Disease

## 2014-12-30 DIAGNOSIS — I10 Essential (primary) hypertension: Secondary | ICD-10-CM | POA: Diagnosis not present

## 2014-12-30 DIAGNOSIS — E780 Pure hypercholesterolemia: Secondary | ICD-10-CM | POA: Diagnosis not present

## 2015-01-06 DIAGNOSIS — Z1212 Encounter for screening for malignant neoplasm of rectum: Secondary | ICD-10-CM | POA: Diagnosis not present

## 2015-01-06 DIAGNOSIS — M810 Age-related osteoporosis without current pathological fracture: Secondary | ICD-10-CM | POA: Diagnosis not present

## 2015-01-06 DIAGNOSIS — I10 Essential (primary) hypertension: Secondary | ICD-10-CM | POA: Diagnosis not present

## 2015-01-06 DIAGNOSIS — E780 Pure hypercholesterolemia: Secondary | ICD-10-CM | POA: Diagnosis not present

## 2015-01-06 DIAGNOSIS — I2510 Atherosclerotic heart disease of native coronary artery without angina pectoris: Secondary | ICD-10-CM | POA: Diagnosis not present

## 2015-02-19 ENCOUNTER — Ambulatory Visit (INDEPENDENT_AMBULATORY_CARE_PROVIDER_SITE_OTHER): Payer: Medicare Other | Admitting: *Deleted

## 2015-02-19 ENCOUNTER — Encounter: Payer: Self-pay | Admitting: Cardiovascular Disease

## 2015-02-19 DIAGNOSIS — I4891 Unspecified atrial fibrillation: Secondary | ICD-10-CM

## 2015-02-19 NOTE — Progress Notes (Signed)
Remote pacemaker transmission.   

## 2015-02-23 DIAGNOSIS — M810 Age-related osteoporosis without current pathological fracture: Secondary | ICD-10-CM | POA: Diagnosis not present

## 2015-02-25 LAB — CUP PACEART REMOTE DEVICE CHECK
Battery Impedance: 231 Ohm
Battery Remaining Longevity: 123 mo
Brady Statistic AP VP Percent: 1 %
Brady Statistic AS VP Percent: 0 %
Date Time Interrogation Session: 20160811112011
Lead Channel Impedance Value: 413 Ohm
Lead Channel Pacing Threshold Amplitude: 0.75 V
Lead Channel Pacing Threshold Pulse Width: 0.4 ms
Lead Channel Sensing Intrinsic Amplitude: 8 mV
Lead Channel Setting Pacing Amplitude: 1.5 V
Lead Channel Setting Pacing Amplitude: 2 V
Lead Channel Setting Pacing Pulse Width: 0.4 ms
MDC IDC MSMT BATTERY VOLTAGE: 2.8 V
MDC IDC MSMT LEADCHNL RV IMPEDANCE VALUE: 686 Ohm
MDC IDC MSMT LEADCHNL RV PACING THRESHOLD AMPLITUDE: 0.75 V
MDC IDC MSMT LEADCHNL RV PACING THRESHOLD PULSEWIDTH: 0.4 ms
MDC IDC SET LEADCHNL RV SENSING SENSITIVITY: 4 mV
MDC IDC STAT BRADY AP VS PERCENT: 97 %
MDC IDC STAT BRADY AS VS PERCENT: 2 %

## 2015-03-11 ENCOUNTER — Encounter: Payer: Self-pay | Admitting: Cardiology

## 2015-03-25 ENCOUNTER — Encounter: Payer: Self-pay | Admitting: Cardiology

## 2015-03-26 DIAGNOSIS — Z23 Encounter for immunization: Secondary | ICD-10-CM | POA: Diagnosis not present

## 2015-05-12 DIAGNOSIS — L812 Freckles: Secondary | ICD-10-CM | POA: Diagnosis not present

## 2015-05-12 DIAGNOSIS — Z85828 Personal history of other malignant neoplasm of skin: Secondary | ICD-10-CM | POA: Diagnosis not present

## 2015-05-12 DIAGNOSIS — D1801 Hemangioma of skin and subcutaneous tissue: Secondary | ICD-10-CM | POA: Diagnosis not present

## 2015-05-12 DIAGNOSIS — L821 Other seborrheic keratosis: Secondary | ICD-10-CM | POA: Diagnosis not present

## 2015-05-21 ENCOUNTER — Ambulatory Visit (INDEPENDENT_AMBULATORY_CARE_PROVIDER_SITE_OTHER): Payer: Medicare Other | Admitting: *Deleted

## 2015-05-21 DIAGNOSIS — I4891 Unspecified atrial fibrillation: Secondary | ICD-10-CM

## 2015-05-21 NOTE — Progress Notes (Signed)
Remote pacemaker transmission.   

## 2015-05-22 ENCOUNTER — Encounter: Payer: Self-pay | Admitting: Cardiology

## 2015-05-22 LAB — CUP PACEART REMOTE DEVICE CHECK
Battery Impedance: 255 Ohm
Battery Voltage: 2.8 V
Brady Statistic AP VS Percent: 97 %
Date Time Interrogation Session: 20161110124843
Implantable Lead Implant Date: 20070421
Implantable Lead Location: 753860
Implantable Lead Model: 5594
Lead Channel Impedance Value: 448 Ohm
Lead Channel Pacing Threshold Pulse Width: 0.4 ms
Lead Channel Sensing Intrinsic Amplitude: 8 mV
Lead Channel Setting Pacing Amplitude: 1.5 V
Lead Channel Setting Pacing Amplitude: 2 V
Lead Channel Setting Sensing Sensitivity: 4 mV
MDC IDC LEAD IMPLANT DT: 20070421
MDC IDC LEAD LOCATION: 753859
MDC IDC MSMT BATTERY REMAINING LONGEVITY: 121 mo
MDC IDC MSMT LEADCHNL RA PACING THRESHOLD AMPLITUDE: 0.75 V
MDC IDC MSMT LEADCHNL RA PACING THRESHOLD PULSEWIDTH: 0.4 ms
MDC IDC MSMT LEADCHNL RV IMPEDANCE VALUE: 667 Ohm
MDC IDC MSMT LEADCHNL RV PACING THRESHOLD AMPLITUDE: 0.625 V
MDC IDC SET LEADCHNL RV PACING PULSEWIDTH: 0.4 ms
MDC IDC STAT BRADY AP VP PERCENT: 1 %
MDC IDC STAT BRADY AS VP PERCENT: 0 %
MDC IDC STAT BRADY AS VS PERCENT: 2 %

## 2015-08-12 IMAGING — US US ABDOMEN COMPLETE
1 series · 13 of 25 positions shown · non-contrast
Comparison: CT 02/20/2014

CLINICAL DATA: Hepatic cysts.

EXAM:
ULTRASOUND ABDOMEN COMPLETE

[Series 1: us abdomen complete · 0.32mm/px · 13 of 84 slices shown]
[im 1/84]
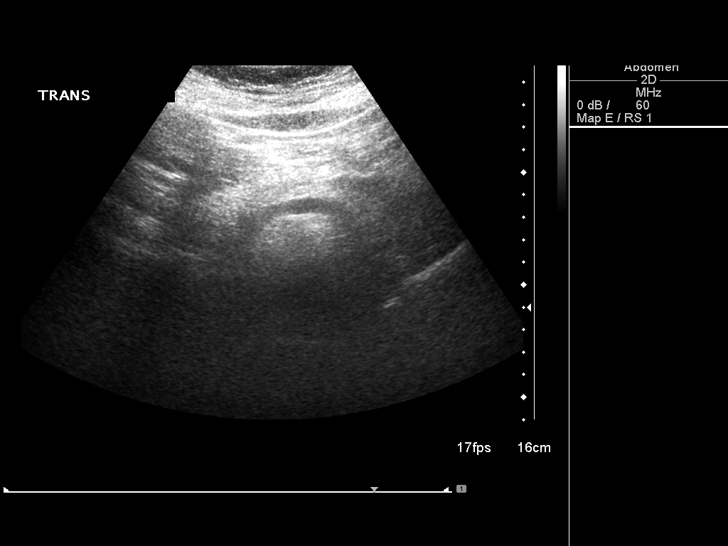
[im 7/84]
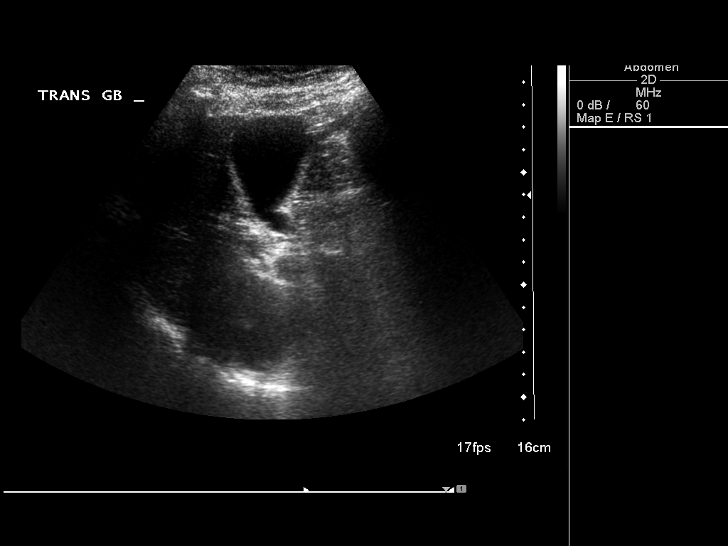
[im 14/84]
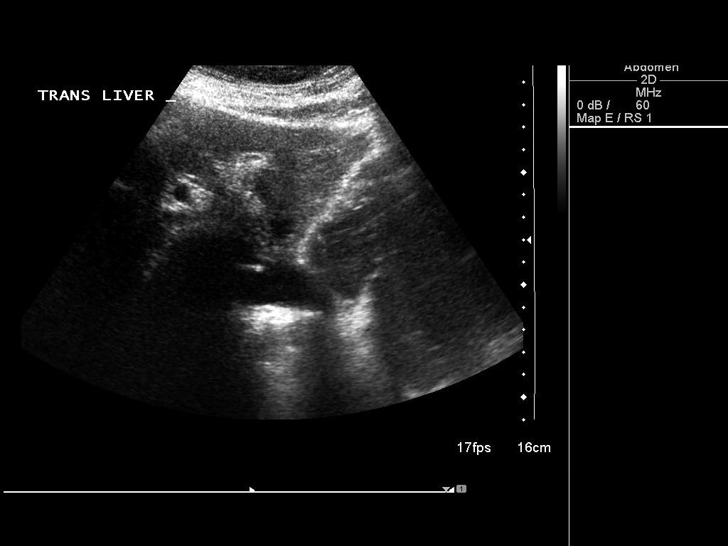
[im 21/84]
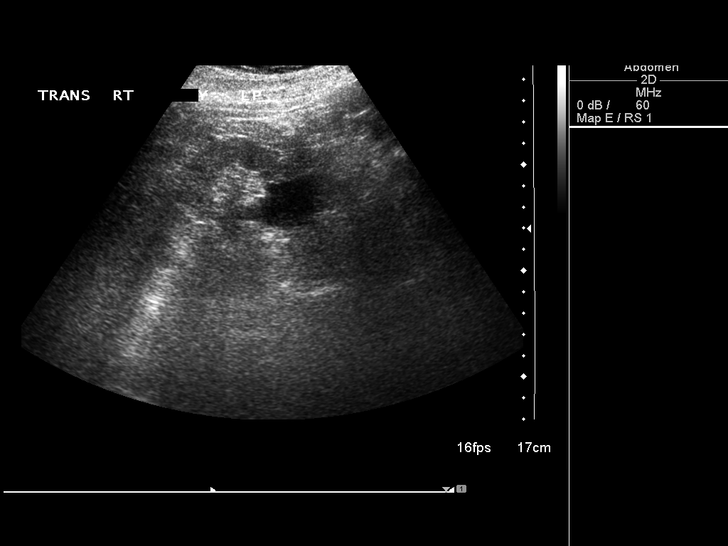
[im 28/84]
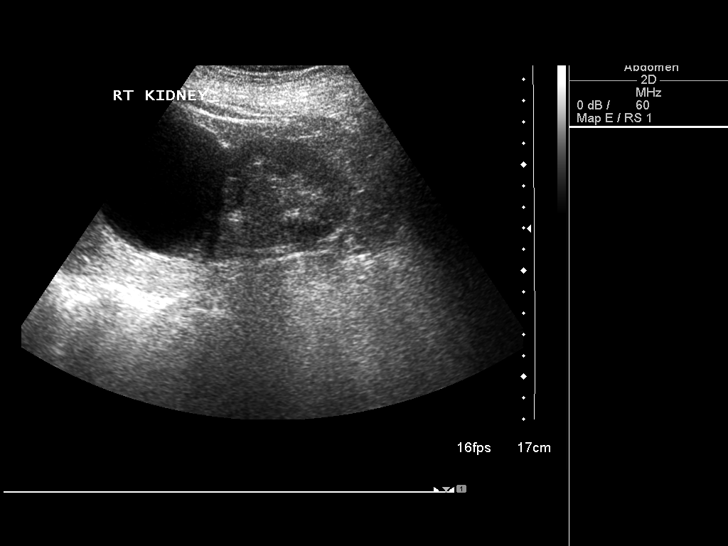
[im 35/84]
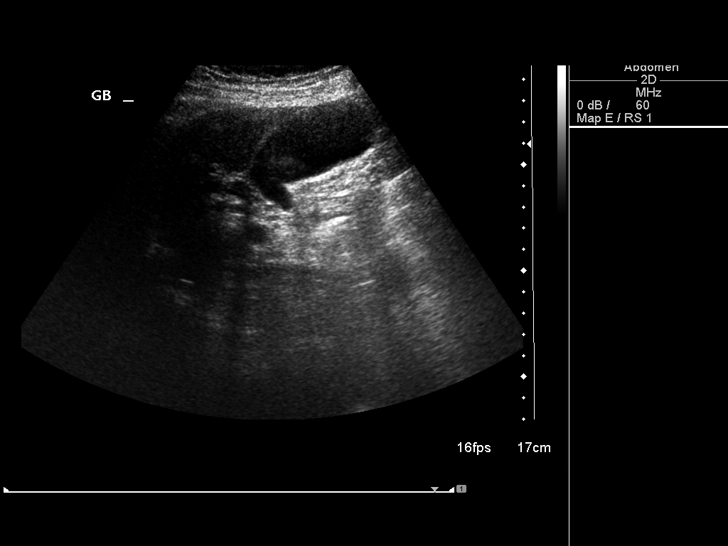
[im 42/84]
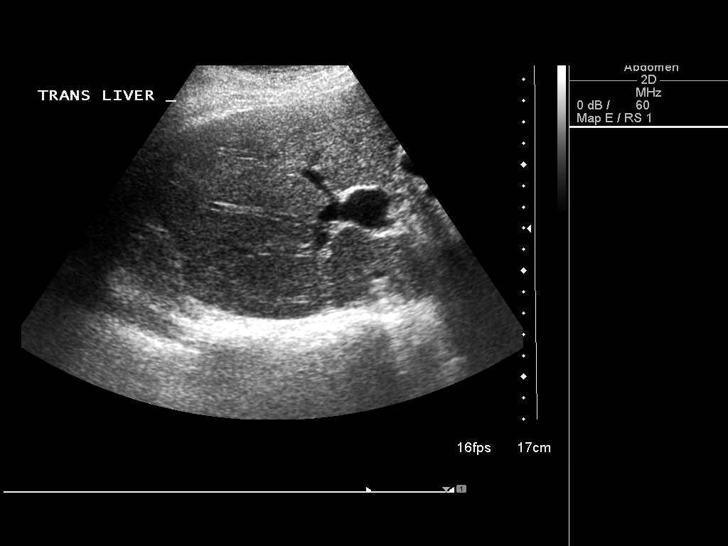
[im 49/84]
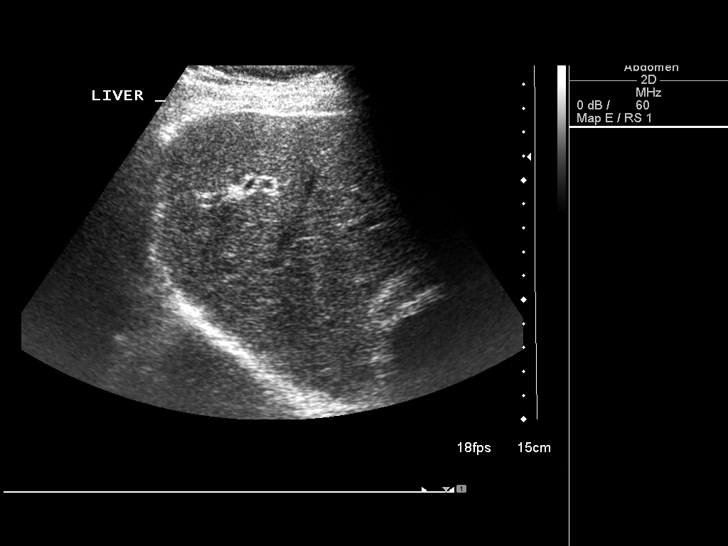
[im 56/84]
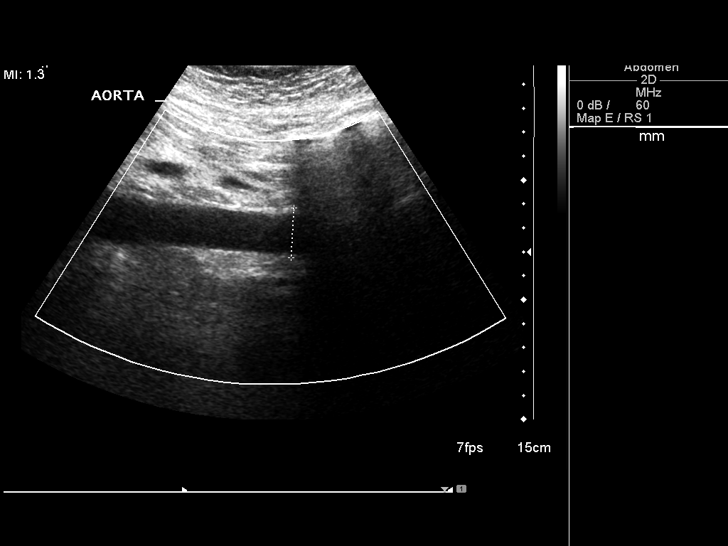
[im 63/84]
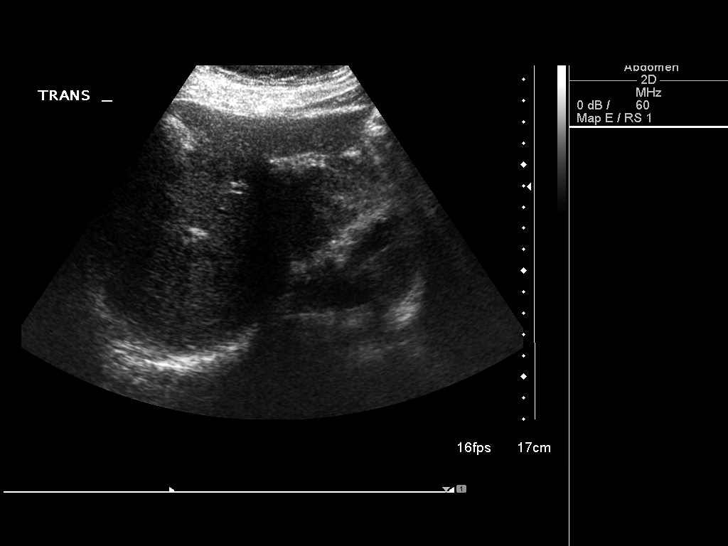
[im 70/84]
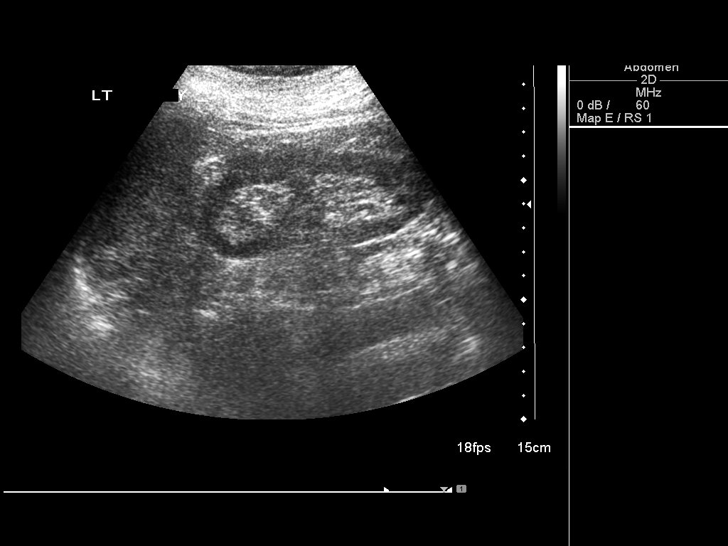
[im 77/84]
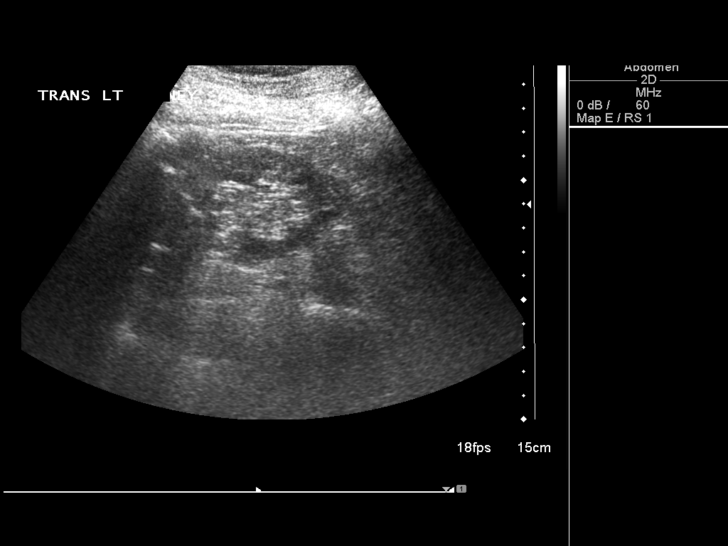
[im 84/84]
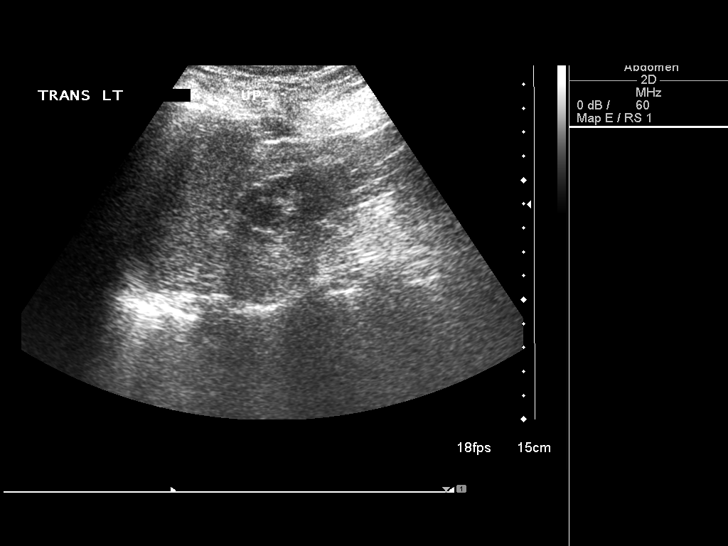

[13 of 25 positions shown; findings below may reference images not displayed]

FINDINGS: Gallbladder: No gallstones or wall thickening visualized. No
sonographic Murphy sign noted.

Common bile duct: Diameter: 8 mm. This is stable. No obstructing
lesion identified.

Liver: No focal lesion identified. Within normal limits in
parenchymal echogenicity. No cyst noted on today's exam.

IVC: No abnormality visualized.

Pancreas: Visualized portion unremarkable.

Spleen: Size and appearance within normal limits.

Right Kidney: Length: 11.0 cm. Echogenicity within normal limits. No
hydronephrosis visualized. Prominent right renal cysts again noted.
The largest is in the upper pole measures 6.3 cm. Thin septation is
noted. This is most likely benign cyst. No change from prior exam .

Left Kidney: Length: 10.0 cm. Mild cortical thinning. Echogenicity
within normal limits. No mass or hydronephrosis visualized.

Abdominal aorta: No aneurysm visualized.

Other findings: None.
IMPRESSION: 1. No hepatic cyst noted on today's exam .
2. Mild prominence of the common bile duct at 8 mm. This is stable.
No obstructing lesion identified. No gallstones.
3. Prominent benign cysts right kidney .
4. Mild left renal cortical thinning.

## 2015-08-20 ENCOUNTER — Ambulatory Visit (INDEPENDENT_AMBULATORY_CARE_PROVIDER_SITE_OTHER): Payer: Medicare Other | Admitting: *Deleted

## 2015-08-20 ENCOUNTER — Telehealth: Payer: Self-pay | Admitting: Cardiology

## 2015-08-20 DIAGNOSIS — I4891 Unspecified atrial fibrillation: Secondary | ICD-10-CM | POA: Diagnosis not present

## 2015-08-20 DIAGNOSIS — I429 Cardiomyopathy, unspecified: Secondary | ICD-10-CM

## 2015-08-20 DIAGNOSIS — I428 Other cardiomyopathies: Secondary | ICD-10-CM

## 2015-08-20 NOTE — Progress Notes (Signed)
Remote pacemaker transmission.   

## 2015-08-20 NOTE — Telephone Encounter (Signed)
LMOVM reminding pt to send remote transmission.   

## 2015-09-13 ENCOUNTER — Encounter: Payer: Self-pay | Admitting: Cardiology

## 2015-09-13 LAB — CUP PACEART REMOTE DEVICE CHECK
Battery Impedance: 279 Ohm
Battery Voltage: 2.8 V
Brady Statistic AP VS Percent: 97 %
Brady Statistic AS VP Percent: 0 %
Brady Statistic AS VS Percent: 2 %
Implantable Lead Implant Date: 20070421
Implantable Lead Implant Date: 20070421
Implantable Lead Location: 753859
Implantable Lead Model: 5092
Lead Channel Impedance Value: 430 Ohm
Lead Channel Setting Pacing Amplitude: 1.5 V
Lead Channel Setting Pacing Pulse Width: 0.4 ms
MDC IDC LEAD LOCATION: 753860
MDC IDC MSMT BATTERY REMAINING LONGEVITY: 117 mo
MDC IDC MSMT LEADCHNL RV IMPEDANCE VALUE: 650 Ohm
MDC IDC SESS DTM: 20170209145450
MDC IDC SET LEADCHNL RV PACING AMPLITUDE: 2 V
MDC IDC SET LEADCHNL RV SENSING SENSITIVITY: 4 mV
MDC IDC STAT BRADY AP VP PERCENT: 1 %

## 2015-09-13 NOTE — Progress Notes (Signed)
Normal remote reviewed. AHR episode with EGM available AT - 1 episode with A rate >400 from 02/2014 [facility] episodes NSVT   Next follow up 11/2015 in clinic.

## 2015-10-14 DIAGNOSIS — H01001 Unspecified blepharitis right upper eyelid: Secondary | ICD-10-CM | POA: Diagnosis not present

## 2015-10-14 DIAGNOSIS — H40003 Preglaucoma, unspecified, bilateral: Secondary | ICD-10-CM | POA: Diagnosis not present

## 2015-10-14 DIAGNOSIS — H527 Unspecified disorder of refraction: Secondary | ICD-10-CM | POA: Diagnosis not present

## 2015-10-14 DIAGNOSIS — D3132 Benign neoplasm of left choroid: Secondary | ICD-10-CM | POA: Diagnosis not present

## 2015-10-14 DIAGNOSIS — D3131 Benign neoplasm of right choroid: Secondary | ICD-10-CM | POA: Diagnosis not present

## 2015-10-14 DIAGNOSIS — H01004 Unspecified blepharitis left upper eyelid: Secondary | ICD-10-CM | POA: Diagnosis not present

## 2015-10-14 DIAGNOSIS — H35373 Puckering of macula, bilateral: Secondary | ICD-10-CM | POA: Diagnosis not present

## 2015-10-14 DIAGNOSIS — Z961 Presence of intraocular lens: Secondary | ICD-10-CM | POA: Diagnosis not present

## 2015-11-26 ENCOUNTER — Other Ambulatory Visit: Payer: Self-pay | Admitting: Cardiovascular Disease

## 2015-11-26 NOTE — Telephone Encounter (Signed)
Rx request sent to pharmacy.  

## 2015-12-15 ENCOUNTER — Ambulatory Visit (INDEPENDENT_AMBULATORY_CARE_PROVIDER_SITE_OTHER): Payer: Medicare Other | Admitting: Cardiovascular Disease

## 2015-12-15 ENCOUNTER — Ambulatory Visit
Admission: RE | Admit: 2015-12-15 | Discharge: 2015-12-15 | Disposition: A | Discharge status: Home / Self Care | Payer: Medicare Other | Source: Ambulatory Visit | Attending: Cardiovascular Disease | Admitting: Cardiovascular Disease

## 2015-12-15 ENCOUNTER — Encounter: Payer: Self-pay | Admitting: Cardiovascular Disease

## 2015-12-15 VITALS — BP 141/73 | HR 89 | Ht 67.0 in | Wt 160.6 lb

## 2015-12-15 DIAGNOSIS — I4891 Unspecified atrial fibrillation: Secondary | ICD-10-CM

## 2015-12-15 DIAGNOSIS — I495 Sick sinus syndrome: Secondary | ICD-10-CM | POA: Diagnosis not present

## 2015-12-15 DIAGNOSIS — R0602 Shortness of breath: Secondary | ICD-10-CM

## 2015-12-15 DIAGNOSIS — I429 Cardiomyopathy, unspecified: Secondary | ICD-10-CM

## 2015-12-15 DIAGNOSIS — I428 Other cardiomyopathies: Secondary | ICD-10-CM

## 2015-12-15 DIAGNOSIS — Z950 Presence of cardiac pacemaker: Secondary | ICD-10-CM

## 2015-12-15 LAB — CUP PACEART INCLINIC DEVICE CHECK
Battery Remaining Longevity: 117 mo
Battery Voltage: 2.8 V
Brady Statistic AP VS Percent: 97 %
Date Time Interrogation Session: 20170606102743
Implantable Lead Implant Date: 20070421
Implantable Lead Model: 5092
Lead Channel Impedance Value: 631 Ohm
Lead Channel Pacing Threshold Pulse Width: 0.4 ms
Lead Channel Setting Pacing Amplitude: 1.5 V
Lead Channel Setting Pacing Amplitude: 2 V
Lead Channel Setting Pacing Pulse Width: 0.4 ms
Lead Channel Setting Sensing Sensitivity: 4 mV
MDC IDC LEAD IMPLANT DT: 20070421
MDC IDC LEAD LOCATION: 753859
MDC IDC LEAD LOCATION: 753860
MDC IDC MSMT BATTERY IMPEDANCE: 279 Ohm
MDC IDC MSMT LEADCHNL RA IMPEDANCE VALUE: 424 Ohm
MDC IDC MSMT LEADCHNL RA PACING THRESHOLD AMPLITUDE: 0.625 V
MDC IDC MSMT LEADCHNL RA PACING THRESHOLD PULSEWIDTH: 0.4 ms
MDC IDC MSMT LEADCHNL RV PACING THRESHOLD AMPLITUDE: 0.75 V
MDC IDC MSMT LEADCHNL RV SENSING INTR AMPL: 8 mV
MDC IDC STAT BRADY AP VP PERCENT: 1 %
MDC IDC STAT BRADY AS VP PERCENT: 0 %
MDC IDC STAT BRADY AS VS PERCENT: 2 %

## 2015-12-15 NOTE — Patient Instructions (Signed)
Medication Instructions: Dr Sallyanne Kuster recommends that you continue on your current medications as directed. Please refer to the Current Medication list given to you today.  Labwork: NONE ORDERED  Testing/Procedures: 1. Chest X-Ray - A chest x-ray takes a picture of the organs and structures inside the chest, including the heart, lungs, and blood vessels. This test can show several things, including, whether the heart is enlarges; whether fluid is building up in the lungs; and whether pacemaker / defibrillator leads are still in place. This has been ordered to be done at [facility] in the [place], [address].  2. Echocardiogram - Your physician has requested that you have an echocardiogram. Echocardiography is a painless test that uses sound waves to create images of your heart. It provides your doctor with information about the size and shape of your heart and how well your heart's chambers and valves are working. This procedure takes approximately one hour. There are no restrictions for this procedure. This will be done at our [facility] location - [address].  3. Remote Pacemaker Download - Remote monitoring is used to monitor your Pacemaker of ICD from home. This monitoring reduces the number of office visits required to check your device to one time per year. It allows [place] to keep an eye on the functioning of your device to ensure it is working properly. You are scheduled for a device check from home on Tuesday, September 5th, 2017. You may send your transmission at any time that day. If you have a wireless device, the transmission will be sent automatically. After your physician reviews your transmission, you will receive a postcard with your next transmission date.  Follow-up: Dr Sallyanne Kuster recommends that you schedule a follow-up appointment in 1 year. You will receive a reminder letter in the mail two months in advance. If you  don't receive a letter, please call our office to schedule the follow-up appointment.  If you need a refill on your cardiac medications before your next appointment, please call your pharmacy.

## 2015-12-16 DIAGNOSIS — I495 Sick sinus syndrome: Secondary | ICD-10-CM | POA: Insufficient documentation

## 2015-12-16 NOTE — Progress Notes (Signed)
Patient ID: Samuel SANO Sr., male   DOB: 05/26/1922, 82 y.o.   MRN: [id number]    Cardiology Office Note    Date:  12/16/2015   ID:  Samuel Running Wendel Sr., DOB October 18, 1921, MRN [id number]  PCP:  Samuel Pel, MD  Cardiologist:   Samuel Klein, MD   Chief Complaint  Patient presents with  . Follow-up    Phys Pacer Check  patient reports shortness of breath with exertion and with rest    History of Present Illness:  Samuel Newsham Plante Sr. is a 82 y.o. male with a history of nonischemic cardiomyopathy (presumed to be PVC related cardiomyopathy), sinus node dysfunction s/p dual-chamber permanent pacemaker, remote history of paroxysmal atrial fibrillation and hyperlipidemia. He returns for routine follow-up but describes some worsening in the shortness of breath. He routinely becomes short of breath climbing even a short flight of stairs, but does not have dyspnea during activities of daily living.  He denies palpitations, syncope, chest discomfort (either pleuritic or anginal), falls, leg edema, claudication, bleeding, fever/chills, cough/hemoptysis, sick contacts, wheezing. As before, he has some crackles in his right base, that I hear some dry crackles in the left base as well today. They do not clear with cough.  He has a remote history of lower extremity DVT. Anticoagulation was stopped roughly 3 years ago due to his advanced age and frailty with concern for falls. His last episode of atrial fibrillation was detected in August 2015 during an acute illness (small bowel obstruction).  Pacemaker interrogation today shows normal findings. He has roughly 9.5 years of estimated generates for longevity, there is 98% atrial pacing and only 1% ventricular pacing, as always he has frequent PVCs and occasional episodes of nonsustained VT. The longest episode was 19 beats in duration. Again there is no recent atrial fibrillation. Heart rate histogram distribution is rather blunted, but he is quite  sedentary.  Past Medical History  Diagnosis Date  . Hypertension   . Coronary artery disease   . Ventricular arrhythmia   . PAF (paroxysmal atrial fibrillation) ([place])   . Nonischemic cardiomyopathy ([place])   . CHF (congestive heart failure) ([place])   . Dyslipidemia   . DVT (deep venous thrombosis) ([facility])     H/O  . PVC's (premature ventricular contractions)     Frequent  . Pacemaker   . Arthritis   . SBO (small bowel obstruction) ([place]) 02/20/2014    Past Surgical History  Procedure Laterality Date  . A-v cardiac pacemaker insertion  02/23/2010    Medtronic  . Cardiac catheterization  04/21/2003    noncritical CAD  . US echocardiography  03/01/2011    pacer induced LBBB,mod. asymmetric LVH, ER 40-45%,mild AI,mild aortic root dilatation, aortic root sclerosis/ca+.  . Nm myocar perf wall motion  02/12/2010    normal  . Abdominal surgery  2009    Current Medications: Outpatient Prescriptions Prior to Visit  Medication Sig Dispense Refill  . Calcium-Vitamin D (CALTRATE 600 PLUS-VIT D PO) Take 600 mg by mouth 2 (two) times daily.    Marland Kitchen ipratropium (ATROVENT) 0.03 % nasal spray Place 2 sprays into the nose daily.    . metoprolol succinate (TOPROL-XL) 50 MG 24 hr tablet Take 1 tablet (50 mg total) by mouth daily. Please keep your upcoming appointment (12/15/15) for refills. 90 tablet 1  . polyethylene glycol powder (GLYCOLAX/MIRALAX) powder Take 17 g by mouth as needed for mild constipation.     . rosuvastatin (CRESTOR) 20 MG tablet Take 20 mg  by mouth daily.     No facility-administered medications prior to visit.     Allergies:   Review of patient's allergies indicates no known allergies.   Social History   Social History  . Marital Status: Married    Spouse Name: N/A  . Number of Children: N/A  . Years of Education: N/A   Social History Main Topics  . Smoking status: Never Smoker   . Smokeless tobacco: Never Used  . Alcohol Use: No  . Drug Use: No  . Sexual Activity: Not  Asked   Other Topics Concern  . None   Social History Narrative     Family History:  The patient's family history includes Breast cancer in his sister; CVA in his father; Cancer in his brother; Leukemia in his mother.   ROS:   Please see the history of present illness.    ROS All other systems reviewed and are negative.   PHYSICAL EXAM:   VS:  BP 141/73 mmHg  Pulse 89  Ht 5\' 7"  (1.702 m)  Wt 72.848 kg (160 lb 9.6 oz)  BMI 25.15 kg/m2   GEN: Well nourished, well developed, in no acute distress HEENT: normal Neck: no JVD, carotid bruits, or masses Cardiac: RRR; no murmurs, rubs, or gallops,no edema , healthy pacemaker site Respiratory:  Dry crackles in bases bilaterally, normal work of breathing GI: soft, nontender, nondistended, + BS MS: no deformity or atrophy Skin: warm and dry, no rash Neuro:  Alert and Oriented x 3, Strength and sensation are intact Psych: euthymic mood, full affect  Wt Readings from Last 3 Encounters:  12/15/15 72.848 kg (160 lb 9.6 oz)  11/18/14 74.254 kg (163 lb 11.2 oz)  02/25/14 70 kg (154 lb 5.2 oz)      Studies/Labs Reviewed:   EKG:  EKG is ordered today.  The ekg ordered today demonstrates Atrial paced, ventricular sensed rhythm. He has a long AV delay of 298 ms. QTC 423 ms. Otherwise normal.    ASSESSMENT:    1. SSS (sick sinus syndrome) ([place])   2. Shortness of breath   3. Nonischemic cardiomyopathy - possibly PVC related   4. Atrial fibrillation, unspecified type ([place])   5. Pacemaker - dual chamber Medtronic 2011      PLAN:  In order of problems listed above:  1. SSS: I don't think his symptoms are secondary to chronotropic incompetence, but it is hard to say since his lifestyle is so sedentary. Rate response sensor settings are nominal. 2. Dyspnea: Samuel Mann believes that his dyspnea has worsened compared to last year. I think he still qualifies as NYHA functional class II. He does have a few more crackles in his lungs that  suggest scarring/interstitial lung disease rather than heart failure. Will order a chest x-ray. We'll also repeat his echocardiogram. 3. CMP: Repeat echo to assess left ventricular systolic function. As always he has frequent PVCs and occasional episodes of asymptomatic nonsustained VT. He is on beta blocker therapy. 4. AFib: No atrial fibrillation has been detected in almost 3 years. He is not taking anticoagulation but is on aspirin 81 mg daily. CHADSVasc 2-3 (age 17, history of CHF but EF is now normal and he does not have clinical heart failure). Felt to be at high risk of falls and injury and warfarin was stopped by his primary care provider a couple of years ago. 5. Normal pacemaker function. Remote downloads every 3 months and office visit yearly.    Medication Adjustments/Labs and Tests  Ordered: Current medicines are reviewed at length with the patient today.  Concerns regarding medicines are outlined above.  Medication changes, Labs and Tests ordered today are listed in the Patient Instructions below. Patient Instructions  Medication Instructions: Dr Sallyanne Kuster recommends that you continue on your current medications as directed. Please refer to the Current Medication list given to you today.  Labwork: NONE ORDERED  Testing/Procedures: 1. Chest X-Ray - A chest x-ray takes a picture of the organs and structures inside the chest, including the heart, lungs, and blood vessels. This test can show several things, including, whether the heart is enlarges; whether fluid is building up in the lungs; and whether pacemaker / defibrillator leads are still in place. This has been ordered to be done at [facility] in the [place], [address].  2. Echocardiogram - Your physician has requested that you have an echocardiogram. Echocardiography is a painless test that uses sound waves to create images of your heart. It provides your doctor with information  about the size and shape of your heart and how well your heart's chambers and valves are working. This procedure takes approximately one hour. There are no restrictions for this procedure. This will be done at our [facility] location - [address].  3. Remote Pacemaker Download - Remote monitoring is used to monitor your Pacemaker of ICD from home. This monitoring reduces the number of office visits required to check your device to one time per year. It allows [place] to keep an eye on the functioning of your device to ensure it is working properly. You are scheduled for a device check from home on Tuesday, September 5th, 2017. You may send your transmission at any time that day. If you have a wireless device, the transmission will be sent automatically. After your physician reviews your transmission, you will receive a postcard with your next transmission date.  Follow-up: Dr Sallyanne Kuster recommends that you schedule a follow-up appointment in 1 year. You will receive a reminder letter in the mail two months in advance. If you don't receive a letter, please call our office to schedule the follow-up appointment.  If you need a refill on your cardiac medications before your next appointment, please call your pharmacy.    Signed, Samuel Klein, MD  12/16/2015 11:05 AM    [facility] [place], [place], [place]  [address] Phone: [phone]; Fax: [phone]

## 2015-12-18 ENCOUNTER — Encounter: Payer: Self-pay | Admitting: Cardiovascular Disease

## 2016-01-01 ENCOUNTER — Other Ambulatory Visit: Payer: Self-pay

## 2016-01-01 ENCOUNTER — Ambulatory Visit (HOSPITAL_COMMUNITY): Payer: Medicare Other | Attending: Cardiovascular Disease

## 2016-01-01 DIAGNOSIS — I351 Nonrheumatic aortic (valve) insufficiency: Secondary | ICD-10-CM | POA: Diagnosis not present

## 2016-01-01 DIAGNOSIS — I110 Hypertensive heart disease with heart failure: Secondary | ICD-10-CM | POA: Insufficient documentation

## 2016-01-01 DIAGNOSIS — I509 Heart failure, unspecified: Secondary | ICD-10-CM | POA: Insufficient documentation

## 2016-01-01 DIAGNOSIS — I2510 Atherosclerotic heart disease of native coronary artery without angina pectoris: Secondary | ICD-10-CM | POA: Diagnosis not present

## 2016-01-01 DIAGNOSIS — E785 Hyperlipidemia, unspecified: Secondary | ICD-10-CM | POA: Diagnosis not present

## 2016-01-01 DIAGNOSIS — R0600 Dyspnea, unspecified: Secondary | ICD-10-CM | POA: Diagnosis present

## 2016-01-01 DIAGNOSIS — R0602 Shortness of breath: Secondary | ICD-10-CM | POA: Diagnosis not present

## 2016-01-01 LAB — ECHOCARDIOGRAM COMPLETE
AOASC: 35 cm
CHL CUP DOP CALC LVOT VTI: 13.8 cm
CHL CUP MV DEC (S): 384
CHL CUP RV SYS PRESS: 28 mmHg
CHL CUP TV REG PEAK VELOCITY: 250 cm/s
E/e' ratio: 11.14
EWDT: 384 ms
FS: 31 % (ref 28–44)
IV/PV OW: 1.69
LA diam end sys: 36 mm
LA diam index: 1.93 cm/m2
LA vol A4C: 59 ml
LASIZE: 36 mm
LAVOL: 52 mL
LAVOLIN: 27.9 mL/m2
LV E/e'average: 11.14
LV TDI E'LATERAL: 4.39
LV TDI E'MEDIAL: 3.41
LV dias vol index: 42 mL/m2
LV sys vol: 42 mL (ref 21–61)
LVDIAVOL: 79 mL (ref 62–150)
LVEEMED: 11.14
LVELAT: 4.39 cm/s
LVOT area: 4.91 cm2
LVOT peak grad rest: 1 mmHg
LVOTD: 25 mm
LVOTPV: 60.9 cm/s
LVOTSV: 68 mL
LVSYSVOLIN: 23 mL/m2
Lateral S' vel: 8.19 cm/s
MV pk A vel: 92.3 m/s
MV pk E vel: 48.9 m/s
P 1/2 time: 401 ms
PW: 7.24 mm — AB (ref 0.6–1.1)
Simpson's disk: 47
Stroke v: 37 ml
TRMAXVEL: 250 cm/s

## 2016-01-06 DIAGNOSIS — I10 Essential (primary) hypertension: Secondary | ICD-10-CM | POA: Diagnosis not present

## 2016-01-06 DIAGNOSIS — Z0000 Encounter for general adult medical examination without abnormal findings: Secondary | ICD-10-CM | POA: Diagnosis not present

## 2016-01-06 DIAGNOSIS — E7800 Pure hypercholesterolemia, unspecified: Secondary | ICD-10-CM | POA: Diagnosis not present

## 2016-01-13 ENCOUNTER — Other Ambulatory Visit (HOSPITAL_COMMUNITY): Payer: Self-pay | Admitting: Respiratory Therapy

## 2016-01-13 DIAGNOSIS — Z1212 Encounter for screening for malignant neoplasm of rectum: Secondary | ICD-10-CM | POA: Diagnosis not present

## 2016-01-13 DIAGNOSIS — M810 Age-related osteoporosis without current pathological fracture: Secondary | ICD-10-CM | POA: Diagnosis not present

## 2016-01-13 DIAGNOSIS — M50322 Other cervical disc degeneration at C5-C6 level: Secondary | ICD-10-CM | POA: Diagnosis not present

## 2016-01-13 DIAGNOSIS — M50323 Other cervical disc degeneration at C6-C7 level: Secondary | ICD-10-CM | POA: Diagnosis not present

## 2016-01-13 DIAGNOSIS — I480 Paroxysmal atrial fibrillation: Secondary | ICD-10-CM | POA: Diagnosis not present

## 2016-01-13 DIAGNOSIS — R0602 Shortness of breath: Secondary | ICD-10-CM | POA: Diagnosis not present

## 2016-01-13 DIAGNOSIS — M47814 Spondylosis without myelopathy or radiculopathy, thoracic region: Secondary | ICD-10-CM | POA: Diagnosis not present

## 2016-01-13 DIAGNOSIS — M40203 Unspecified kyphosis, cervicothoracic region: Secondary | ICD-10-CM | POA: Diagnosis not present

## 2016-01-20 ENCOUNTER — Ambulatory Visit (HOSPITAL_COMMUNITY)
Admission: RE | Admit: 2016-01-20 | Discharge: 2016-01-20 | Disposition: A | Discharge status: Home / Self Care | Payer: Medicare Other | Source: Ambulatory Visit | Attending: Internal Medicine | Admitting: Internal Medicine

## 2016-01-20 DIAGNOSIS — R0602 Shortness of breath: Secondary | ICD-10-CM | POA: Diagnosis not present

## 2016-01-20 LAB — SPIROMETRY WITH GRAPH
FEF 25-75 Pre: 1.53 L/sec
FEF2575-%Pred-Pre: 154 %
FEV1-%PRED-PRE: 98 %
FEV1-Pre: 1.81 L
FEV1FVC-%Pred-Pre: 110 %
FEV6-%PRED-PRE: 92 %
FEV6-PRE: 2.35 L
FEV6FVC-%Pred-Pre: 110 %
FVC-%Pred-Pre: 84 %
FVC-Pre: 2.37 L
PRE FEV1/FVC RATIO: 76 %
Pre FEV6/FVC Ratio: 99 %

## 2016-02-11 ENCOUNTER — Encounter: Payer: Self-pay | Admitting: *Deleted

## 2016-02-11 ENCOUNTER — Telehealth: Payer: Self-pay | Admitting: Pulmonary Disease

## 2016-02-11 ENCOUNTER — Ambulatory Visit (INDEPENDENT_AMBULATORY_CARE_PROVIDER_SITE_OTHER): Payer: Medicare Other | Admitting: Pulmonary Disease

## 2016-02-11 DIAGNOSIS — R0600 Dyspnea, unspecified: Secondary | ICD-10-CM | POA: Diagnosis not present

## 2016-02-11 NOTE — Patient Instructions (Signed)
   Please call me if you develop any new breathing problems or feel your shortness of breath is getting worse.  I will see you back as needed.

## 2016-02-11 NOTE — Progress Notes (Signed)
Subjective:    Patient ID: Samuel Pellegrini Sr., male    DOB: 11/30/1921, 82 y.o.   MRN: [id number]  HPI Patient referred for basilar inspiratory crackles noted on physical exam 01/13/16. He reports he does have dyspnea. He reports chronic dyspnea for at least a year that is unchanged. Dyspnea primarily occurs on exertion, especially going up stairs. He reports he is able to climb the 14 steps in his home without stopping at the top. Denies any coughing or wheezing. He denies any chest tightness, pain, or lower extremity edema. Denies any orthopnea or PND. No fever, chills, or sweats. No rashes bu does bruise easily. No reflux, dyspepsia, or morning brash water taste. Denies any history of breathing problems, bronchitis, or pneumonia.   Review of Systems He reports he does have trouble standing up fully erect. No joint swelling, erythema, or stiffness. No dry eyes or dry mouth. A pertinent 14 point review of systems is negative except as per the history of presenting illness.  No Known Allergies  Current Outpatient Prescriptions on File Prior to Visit  Medication Sig Dispense Refill  . aspirin EC 81 MG tablet Take 81 mg by mouth daily.    . Calcium-Vitamin D (CALTRATE 600 PLUS-VIT D PO) Take 600 mg by mouth 2 (two) times daily.    Marland Kitchen ipratropium (ATROVENT) 0.03 % nasal spray Place 2 sprays into the nose daily.    . metoprolol succinate (TOPROL-XL) 50 MG 24 hr tablet Take 1 tablet (50 mg total) by mouth daily. Please keep your upcoming appointment (12/15/15) for refills. 90 tablet 1  . Multiple Vitamin (MULTIVITAMIN) tablet Take 1 tablet by mouth daily.    . polyethylene glycol powder (GLYCOLAX/MIRALAX) powder Take 17 g by mouth as needed for mild constipation.     . rosuvastatin (CRESTOR) 20 MG tablet Take 20 mg by mouth daily.     No current facility-administered medications on file prior to visit.     Past Medical History:  Diagnosis Date  . Arthritis   . BPH (benign prostatic hyperplasia)     . CHF (congestive heart failure) ([place])   . CKD (chronic kidney disease)   . Coronary artery disease   . DVT (deep venous thrombosis) ([place])    H/O  . Dyslipidemia   . Hypertension   . Nonischemic cardiomyopathy ([place])   . Osteoarthritis   . Pacemaker   . PAF (paroxysmal atrial fibrillation) ([place])   . PVC's (premature ventricular contractions)    Frequent  . SBO (small bowel obstruction) ([place]) 02/20/2014  . Ventricular arrhythmia     Past Surgical History:  Procedure Laterality Date  . A-V CARDIAC PACEMAKER INSERTION  02/23/2010   Medtronic  . ABDOMINAL SURGERY  2009  . APPENDECTOMY    . BASAL CELL CARCINOMA EXCISION     from ear  . CARDIAC CATHETERIZATION  04/21/2003   noncritical CAD  . cataract    . NM MYOCAR PERF WALL MOTION  02/12/2010   normal  . PARTIAL COLECTOMY    . TONSILLECTOMY    . US ECHOCARDIOGRAPHY  03/01/2011   pacer induced LBBB,mod. asymmetric LVH, ER 40-45%,mild AI,mild aortic root dilatation, aortic root sclerosis/ca+.  Marland Kitchen VASECTOMY      Family History  Problem Relation Age of Onset  . Leukemia Mother   . CVA Father   . Asthma Father   . Cancer Brother     unsure type  . Heart disease Sister   . Anemia Sister   . Rheumatologic  disease Neg Hx     Social History   Social History  . Marital status: Married    Spouse name: N/A  . Number of children: 2  . Years of education: N/A   Occupational History  . retired    Social History Main Topics  . Smoking status: Passive Smoke Exposure - Never Smoker  . Smokeless tobacco: Never Used     Comment: At Work  . Alcohol use No  . Drug use: No  . Sexual activity: Not Asked   Other Topics Concern  . None   Social History Narrative   Originally from [place]. He has lived in [place] as well. He moved to [place] in 1973. Previously worked as a Nurse, mental health. He does enjoy wood working and uses walnut and [employer], no exotics. No pets currently. No bird, mold, or hot tub exposure. He did have some very rare  exposure to asbestos in the 1940s. 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 He was in the [employer].       Objective:   Physical Exam BP 132/62 (BP Location: Left Arm, Cuff Size: Normal)   Pulse 82   Ht 5\' 6"  (1.676 m)   Wt 161 lb 3.2 oz (73.1 kg)   SpO2 97%   BMI 26.02 kg/m  General:  Awake. Alert. No acute distress.   Integument:  Warm & dry. No rash on exposed skin. Mild bruising of various ages on bilateral upper extremities. Lymphatics:  No appreciated cervical or supraclavicular lymphadenoapthy. HEENT:  Moist mucus membranes. No oral ulcers. No scleral injection or icterus. Minimal nasal turbinate swelling. Cardiovascular:  Regular rate. No edema. No appreciable JVD.  Pulmonary:  Good aeration bilaterally with faint basilar crackles. Symmetric chest wall expansion. No accessory muscle use on room air. Abdomen: Soft. Normal bowel sounds. Nondistended. Grossly nontender. Musculoskeletal:  Normal bulk and tone. Hand grip strength 5/5 bilaterally. No joint deformity or effusion appreciated. Neurological:  CN 2-12 grossly in tact. No meningismus. Moving all 4 extremities equally. Symmetric brachioradialis deep tendon reflexes. Psychiatric:  Mood and affect congruent. Speech normal rhythm, rate & tone.   PFT 01/20/16: FVC 2.37 L (84%) FEV1 1.81 L (98%) FEV1/FVC 0.76 FEF 25-75 1.53 L (154%)  IMAGING CXR PA/LAT 12/15/15 (personally reviewed by me): No parenchymal nodule or opacity appreciated. No pleural effusion. Low lung volumes. Heart normal in size & mediastinum normal in contour.  CARDIAC  TTE (01/01/16): Mild LVH with mild dilation. EF 50-55%. No regional wall motion abnormalities. Grade 1 diastolic dysfunction. LA & RA normal in size. RV normal in size and function. Mild aortic regurgitation without stenosis. Aortic root normal in size. Trivial mitral regurgitation without stenosis. Trivial pulmonic regurgitation without stenosis. Mild tricuspid regurgitation. No pericardial  effusion.     Assessment & Plan:  82 y.o. male with very mild basilar crackles on physical exam. I reviewed the patient's chest x-ray with him today as well as his pulmonary function testing/spirometry from July. His spirometry was normal except for a significantly elevated mid flow volume which could indicate restriction. This is not suggested on his spirometry. Additionally, I can see no parenchymal abnormalities on x-ray that would account for the crackles on physical exam. Certainly with his kyphosis this could explain any potential restriction. He has no familial history of interstitial lung disease or rheumatological disease that would predispose found. He has very brief exposure to asbestos and no findings on chest x-ray that would indicate pleural or parenchymal disease. We did  discuss the limitations of chest x-ray imaging and the fact that CT imaging would provide a more detailed evaluation of the lungs. However, I would not recommend imaging him until he had full pulmonary function testing and even a 6 minute walk test to further and completely evaluate his lung function. His oxygen saturation today was completely normal. I explained that with ambulation and hypoxia on ambulation this could result in a cardiac strain that could further affect his heart and thereby his breathing. After lengthy discussion with patient and he wishes to continue to monitor symptoms at this time and defers further testing at this time. He will contact my office if his symptoms worsen or he develops new symptoms and we will then proceed with evaluation.  1. Dyspnea: Patient wishes to defer further testing at this time. Continuing to monitor symptoms. If things worsen plan for full pulmonary function testing, 6 minute walk test on room air, and possibly a CT/high-resolution CT chest pending upon these results. 2. Follow-up: Patient will return to clinic on an as-needed basis.  Sonia Baller Ashok Cordia, M.D. [facility] Pulmonary  & Critical Care Pager:  [phone] After 3pm or if no response, call [phone] 3:34 PM 02/11/16

## 2016-02-11 NOTE — Telephone Encounter (Signed)
PFT 01/20/16: FVC 2.37 L (84%) FEV1 1.81 L (98%) FEV1/FVC 0.76 FEF 25-75 1.53 L (154%)  IMAGING CXR PA/LAT 12/15/15 (personally reviewed by me): No parenchymal nodule or opacity appreciated. No pleural effusion. Low lung volumes. Heart normal in size & mediastinum normal in contour.  CARDIAC  TTE (01/01/16): Mild LVH with mild dilation. EF 50-55%. No regional wall motion abnormalities. Grade 1 diastolic dysfunction. LA & RA normal in size. RV normal in size and function. Mild aortic regurgitation without stenosis. Aortic root normal in size. Trivial mitral regurgitation without stenosis. Trivial pulmonic regurgitation without stenosis. Mild tricuspid regurgitation. No pericardial effusion.

## 2016-03-15 ENCOUNTER — Ambulatory Visit (INDEPENDENT_AMBULATORY_CARE_PROVIDER_SITE_OTHER): Payer: Medicare Other | Admitting: *Deleted

## 2016-03-15 DIAGNOSIS — I495 Sick sinus syndrome: Secondary | ICD-10-CM | POA: Diagnosis not present

## 2016-03-16 LAB — CUP PACEART REMOTE DEVICE CHECK
Battery Impedance: 328 Ohm
Battery Voltage: 2.8 V
Brady Statistic AP VP Percent: 1 %
Brady Statistic AP VS Percent: 97 %
Brady Statistic AS VP Percent: 0 %
Implantable Lead Implant Date: 20070421
Implantable Lead Location: 753860
Implantable Lead Model: 5092
Implantable Lead Model: 5594
Lead Channel Impedance Value: 436 Ohm
Lead Channel Impedance Value: 616 Ohm
Lead Channel Pacing Threshold Amplitude: 0.75 V
Lead Channel Pacing Threshold Pulse Width: 0.4 ms
Lead Channel Setting Pacing Amplitude: 2 V
Lead Channel Setting Sensing Sensitivity: 4 mV
MDC IDC LEAD IMPLANT DT: 20070421
MDC IDC LEAD LOCATION: 753859
MDC IDC MSMT BATTERY REMAINING LONGEVITY: 111 mo
MDC IDC MSMT LEADCHNL RV PACING THRESHOLD AMPLITUDE: 0.75 V
MDC IDC MSMT LEADCHNL RV PACING THRESHOLD PULSEWIDTH: 0.4 ms
MDC IDC MSMT LEADCHNL RV SENSING INTR AMPL: 8 mV
MDC IDC SESS DTM: 20170905115930
MDC IDC SET LEADCHNL RA PACING AMPLITUDE: 1.5 V
MDC IDC SET LEADCHNL RV PACING PULSEWIDTH: 0.4 ms
MDC IDC STAT BRADY AS VS PERCENT: 2 %

## 2016-03-16 NOTE — Progress Notes (Signed)
Remote pacemaker transmission.   

## 2016-03-18 ENCOUNTER — Encounter: Payer: Self-pay | Admitting: Cardiology

## 2016-03-31 DIAGNOSIS — H35373 Puckering of macula, bilateral: Secondary | ICD-10-CM | POA: Diagnosis not present

## 2016-03-31 DIAGNOSIS — D3132 Benign neoplasm of left choroid: Secondary | ICD-10-CM | POA: Diagnosis not present

## 2016-03-31 DIAGNOSIS — H43822 Vitreomacular adhesion, left eye: Secondary | ICD-10-CM | POA: Diagnosis not present

## 2016-03-31 DIAGNOSIS — D3131 Benign neoplasm of right choroid: Secondary | ICD-10-CM | POA: Diagnosis not present

## 2016-04-01 DIAGNOSIS — Z23 Encounter for immunization: Secondary | ICD-10-CM | POA: Diagnosis not present

## 2016-05-12 DIAGNOSIS — Z85828 Personal history of other malignant neoplasm of skin: Secondary | ICD-10-CM | POA: Diagnosis not present

## 2016-05-12 DIAGNOSIS — C44619 Basal cell carcinoma of skin of left upper limb, including shoulder: Secondary | ICD-10-CM | POA: Diagnosis not present

## 2016-05-12 DIAGNOSIS — L812 Freckles: Secondary | ICD-10-CM | POA: Diagnosis not present

## 2016-05-12 DIAGNOSIS — L821 Other seborrheic keratosis: Secondary | ICD-10-CM | POA: Diagnosis not present

## 2016-05-12 DIAGNOSIS — D485 Neoplasm of uncertain behavior of skin: Secondary | ICD-10-CM | POA: Diagnosis not present

## 2016-05-12 DIAGNOSIS — D1801 Hemangioma of skin and subcutaneous tissue: Secondary | ICD-10-CM | POA: Diagnosis not present

## 2016-06-05 ENCOUNTER — Other Ambulatory Visit: Payer: Self-pay | Admitting: Cardiovascular Disease

## 2016-06-06 NOTE — Telephone Encounter (Signed)
REFILL 

## 2016-06-15 ENCOUNTER — Ambulatory Visit (INDEPENDENT_AMBULATORY_CARE_PROVIDER_SITE_OTHER): Payer: Medicare Other | Admitting: *Deleted

## 2016-06-15 ENCOUNTER — Telehealth: Payer: Self-pay | Admitting: Cardiology

## 2016-06-15 DIAGNOSIS — I495 Sick sinus syndrome: Secondary | ICD-10-CM

## 2016-06-15 NOTE — Telephone Encounter (Signed)
Attempted to confirm remote transmission with pt. No answer and was unable to leave a message.   

## 2016-06-16 NOTE — Progress Notes (Signed)
Remote pacemaker transmission.   

## 2016-06-22 ENCOUNTER — Encounter: Payer: Self-pay | Admitting: Cardiology

## 2016-06-29 DIAGNOSIS — J302 Other seasonal allergic rhinitis: Secondary | ICD-10-CM | POA: Diagnosis not present

## 2016-07-03 DIAGNOSIS — J3089 Other allergic rhinitis: Secondary | ICD-10-CM | POA: Diagnosis not present

## 2016-07-08 DIAGNOSIS — J069 Acute upper respiratory infection, unspecified: Secondary | ICD-10-CM | POA: Diagnosis not present

## 2016-07-12 DIAGNOSIS — H35373 Puckering of macula, bilateral: Secondary | ICD-10-CM | POA: Diagnosis not present

## 2016-07-12 DIAGNOSIS — D3132 Benign neoplasm of left choroid: Secondary | ICD-10-CM | POA: Diagnosis not present

## 2016-07-12 DIAGNOSIS — D3131 Benign neoplasm of right choroid: Secondary | ICD-10-CM | POA: Diagnosis not present

## 2016-07-12 DIAGNOSIS — H40003 Preglaucoma, unspecified, bilateral: Secondary | ICD-10-CM | POA: Diagnosis not present

## 2016-07-12 LAB — CUP PACEART REMOTE DEVICE CHECK
Battery Remaining Longevity: 108 mo
Brady Statistic AP VP Percent: 1 %
Brady Statistic AS VP Percent: 0 %
Brady Statistic AS VS Percent: 2 %
Date Time Interrogation Session: 20171206202002
Implantable Lead Location: 753860
Implantable Lead Model: 5594
Lead Channel Impedance Value: 613 Ohm
Lead Channel Pacing Threshold Amplitude: 0.75 V
Lead Channel Pacing Threshold Amplitude: 0.75 V
Lead Channel Pacing Threshold Pulse Width: 0.4 ms
Lead Channel Setting Pacing Amplitude: 2 V
Lead Channel Setting Pacing Pulse Width: 0.4 ms
MDC IDC LEAD IMPLANT DT: 20070421
MDC IDC LEAD IMPLANT DT: 20070421
MDC IDC LEAD LOCATION: 753859
MDC IDC MSMT BATTERY IMPEDANCE: 351 Ohm
MDC IDC MSMT BATTERY VOLTAGE: 2.8 V
MDC IDC MSMT LEADCHNL RA IMPEDANCE VALUE: 408 Ohm
MDC IDC MSMT LEADCHNL RA PACING THRESHOLD PULSEWIDTH: 0.4 ms
MDC IDC PG IMPLANT DT: 20110816
MDC IDC SET LEADCHNL RA PACING AMPLITUDE: 1.5 V
MDC IDC SET LEADCHNL RV SENSING SENSITIVITY: 4 mV
MDC IDC STAT BRADY AP VS PERCENT: 97 %

## 2016-08-02 DIAGNOSIS — H6982 Other specified disorders of Eustachian tube, left ear: Secondary | ICD-10-CM | POA: Diagnosis not present

## 2016-09-09 ENCOUNTER — Other Ambulatory Visit: Payer: Self-pay

## 2016-09-09 ENCOUNTER — Telehealth: Payer: Self-pay | Admitting: Cardiovascular Disease

## 2016-09-09 MED ORDER — METOPROLOL SUCCINATE ER 50 MG PO TB24: 50.0000 mg | ORAL_TABLET | Freq: Every day | ORAL | 2 refills | Status: DC

## 2016-09-09 MED ORDER — METOPROLOL SUCCINATE ER 50 MG PO TB24: 50.0000 mg | ORAL_TABLET | Freq: Every day | ORAL | 0 refills | Status: DC

## 2016-09-09 NOTE — Telephone Encounter (Signed)
New message        *STAT* If patient is at the pharmacy, call can be transferred to refill team.   1. Which medications need to be refilled? (please list name of each medication and dose if known) metoprolol  2. Which pharmacy/location (including street and city if local pharmacy) is medication to be sent to? [facility] 3. Do they need a 30 day or 90 day supply?  [place]

## 2016-09-09 NOTE — Telephone Encounter (Signed)
Refill sent to the pharmacy electronically.  

## 2016-09-14 ENCOUNTER — Ambulatory Visit (INDEPENDENT_AMBULATORY_CARE_PROVIDER_SITE_OTHER): Payer: Medicare Other | Admitting: *Deleted

## 2016-09-14 ENCOUNTER — Telehealth: Payer: Self-pay | Admitting: Cardiology

## 2016-09-14 DIAGNOSIS — I495 Sick sinus syndrome: Secondary | ICD-10-CM

## 2016-09-14 NOTE — Progress Notes (Signed)
Remote pacemaker transmission.   

## 2016-09-14 NOTE — Telephone Encounter (Signed)
Spoke with pt and reminded pt of remote transmission that is due today. Pt verbalized understanding.   

## 2016-09-15 ENCOUNTER — Encounter: Payer: Self-pay | Admitting: Cardiology

## 2016-09-16 LAB — CUP PACEART REMOTE DEVICE CHECK
Battery Remaining Longevity: 106 mo
Battery Voltage: 2.8 V
Date Time Interrogation Session: 20180307163759
Implantable Lead Implant Date: 20070421
Implantable Lead Location: 753859
Implantable Lead Model: 5594
Implantable Pulse Generator Implant Date: 20110816
Lead Channel Pacing Threshold Amplitude: 0.625 V
Lead Channel Pacing Threshold Pulse Width: 0.4 ms
Lead Channel Setting Pacing Amplitude: 1.5 V
Lead Channel Setting Pacing Pulse Width: 0.4 ms
MDC IDC LEAD IMPLANT DT: 20070421
MDC IDC LEAD LOCATION: 753860
MDC IDC MSMT BATTERY IMPEDANCE: 377 Ohm
MDC IDC MSMT LEADCHNL RA IMPEDANCE VALUE: 424 Ohm
MDC IDC MSMT LEADCHNL RA PACING THRESHOLD PULSEWIDTH: 0.4 ms
MDC IDC MSMT LEADCHNL RV IMPEDANCE VALUE: 625 Ohm
MDC IDC MSMT LEADCHNL RV PACING THRESHOLD AMPLITUDE: 0.75 V
MDC IDC SET LEADCHNL RV PACING AMPLITUDE: 2 V
MDC IDC SET LEADCHNL RV SENSING SENSITIVITY: 4 mV
MDC IDC STAT BRADY AP VP PERCENT: 1 %
MDC IDC STAT BRADY AP VS PERCENT: 97 %
MDC IDC STAT BRADY AS VP PERCENT: 0 %
MDC IDC STAT BRADY AS VS PERCENT: 2 %

## 2016-11-08 DIAGNOSIS — D3132 Benign neoplasm of left choroid: Secondary | ICD-10-CM | POA: Diagnosis not present

## 2016-11-08 DIAGNOSIS — H02834 Dermatochalasis of left upper eyelid: Secondary | ICD-10-CM | POA: Diagnosis not present

## 2016-11-08 DIAGNOSIS — H35373 Puckering of macula, bilateral: Secondary | ICD-10-CM | POA: Diagnosis not present

## 2016-11-08 DIAGNOSIS — H52211 Irregular astigmatism, right eye: Secondary | ICD-10-CM | POA: Diagnosis not present

## 2016-11-08 DIAGNOSIS — H527 Unspecified disorder of refraction: Secondary | ICD-10-CM | POA: Diagnosis not present

## 2016-11-08 DIAGNOSIS — H40003 Preglaucoma, unspecified, bilateral: Secondary | ICD-10-CM | POA: Diagnosis not present

## 2016-11-08 DIAGNOSIS — D3131 Benign neoplasm of right choroid: Secondary | ICD-10-CM | POA: Diagnosis not present

## 2016-12-04 NOTE — Progress Notes (Signed)
Patient ID: Samuel GODEAUX Sr., male   DOB: 1922/04/06, 83 y.o.   MRN: [id number]    Cardiology Office Note    Date:  12/16/2016   ID:  Samuel Running Pattison Sr., DOB [birth year], MRN [id number]  PCP:  Deland Pretty, MD  Cardiologist:   Sanda Klein, MD   Chief complaint: Pacemaker check   History of Present Illness:  Samuel Mullens Axford Sr. is a 83 y.o. male with a history of nonischemic cardiomyopathy (presumed to be PVC related cardiomyopathy), sinus node dysfunction s/p dual-chamber permanent pacemaker, remote history of paroxysmal atrial fibrillation and hyperlipidemia.   He is doing pretty well. Doesn't seem to be has troubled by shortness of breath as last year, or he's become used to it. He continues to live independently with his wife. He drives short distances for grocery shopping and similar chores. Takes care of household cleaning. He has a daughter in the room and a son in [place]. His daughter checks on him weekly.  He has a remote history of lower extremity DVT. Anticoagulation was stopped roughly 3 years ago due to his advanced age and frailty with concern for falls. His last episode of atrial fibrillation was detected in August 2015 during an acute illness (small bowel obstruction).  Pacemaker interrogation today shows normal findings. He has roughly 8.5 years of estimated generator longevity, there is 98 % atrial pacing and less than 1 % ventricular pacing. As always he has frequent PVCs and occasional episodes of nonsustained VT. The longest episode was relatively long, 13 seconds in duration. It happened around midnight when he was already asleep. He is unaware of the ectopy. No recent atrial fibrillation. Heart rate histogram distribution is as always rather blunted, but he is quite sedentary.  The patient specifically denies any chest pain at rest exertion, dyspnea at rest, orthopnea, paroxysmal nocturnal dyspnea, syncope, palpitations, focal neurological deficits, intermittent  claudication, lower extremity edema, unexplained weight gain, cough, hemoptysis or wheezing.   Past Medical History:  Diagnosis Date  . Arthritis   . BPH (benign prostatic hyperplasia)   . CHF (congestive heart failure) ([place])   . CKD (chronic kidney disease)   . Coronary artery disease   . DVT (deep venous thrombosis) ([place])    H/O  . Dyslipidemia   . Hypertension   . Nonischemic cardiomyopathy ([place])   . Osteoarthritis   . Pacemaker   . PAF (paroxysmal atrial fibrillation) ([place])   . PVC's (premature ventricular contractions)    Frequent  . SBO (small bowel obstruction) ([place]) 02/20/2014  . Ventricular arrhythmia     Past Surgical History:  Procedure Laterality Date  . A-V CARDIAC PACEMAKER INSERTION  02/23/2010   Medtronic  . ABDOMINAL SURGERY  2009  . APPENDECTOMY    . BASAL CELL CARCINOMA EXCISION     from ear  . CARDIAC CATHETERIZATION  04/21/2003   noncritical CAD  . cataract    . NM MYOCAR PERF WALL MOTION  02/12/2010   normal  . PARTIAL COLECTOMY    . TONSILLECTOMY    . US ECHOCARDIOGRAPHY  03/01/2011   pacer induced LBBB,mod. asymmetric LVH, ER 40-45%,mild AI,mild aortic root dilatation, aortic root sclerosis/ca+.  Marland Kitchen VASECTOMY      Current Medications: Outpatient Medications Prior to Visit  Medication Sig Dispense Refill  . aspirin EC 81 MG tablet Take 81 mg by mouth daily.    . Calcium-Vitamin D (CALTRATE 600 PLUS-VIT D PO) Take 600 mg by mouth 2 (two) times daily.    Marland Kitchen  ipratropium (ATROVENT) 0.03 % nasal spray Place 2 sprays into the nose daily.    . metoprolol succinate (TOPROL-XL) 50 MG 24 hr tablet Take 1 tablet (50 mg total) by mouth daily. 90 tablet 2  . Multiple Vitamin (MULTIVITAMIN) tablet Take 1 tablet by mouth daily.    . polyethylene glycol powder (GLYCOLAX/MIRALAX) powder Take 17 g by mouth as needed for mild constipation.     . rosuvastatin (CRESTOR) 20 MG tablet Take 20 mg by mouth daily.     No facility-administered medications prior to visit.        Allergies:   Patient has no known allergies.   Social History   Social History  . Marital status: Married    Spouse name: N/A  . Number of children: 2  . Years of education: N/A   Occupational History  . retired    Social History Main Topics  . Smoking status: Passive Smoke Exposure - Never Smoker  . Smokeless tobacco: Never Used     Comment: At Work  . Alcohol use No  . Drug use: No  . Sexual activity: Not Asked   Other Topics Concern  . None   Social History Narrative   Originally from [place]. He has lived in [place] as well. He moved to [place] in 1973. Previously worked as a Nurse, mental health. He does enjoy wood working and uses [employer]nut and [employer], no exotics. No pets currently. No bird, mold, or hot tub exposure. He did have some very rare exposure to asbestos in the 1940s. 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 He was in the [employer].      Family History:  The patient's family history includes Anemia in his sister; Asthma in his father; CVA in his father; Cancer in his brother; Heart disease in his sister; Leukemia in his mother.   ROS:   Please see the history of present illness.    ROS All other systems reviewed and are negative.   PHYSICAL EXAM:   VS:  BP (!) 147/82 (BP Location: Left Arm, Patient Position: Sitting, Cuff Size: Normal)   Pulse 85   Ht 5\' 6"  (1.676 m)   Wt 161 lb (73 kg)   BMI 25.99 kg/m     General: Alert, oriented x3, no distress.  Appears elderly and frail  General: Alert, oriented x3, no distress Head: no evidence of trauma, PERRL, EOMI, no exophtalmos or lid lag, no myxedema, no xanthelasma; normal ears, nose and oropharynx Neck: normal jugular venous pulsations and no hepatojugular reflux; brisk carotid pulses without delay and no carotid bruits Chest: Dry crackles in both bases, no signs of consolidation by percussion or palpation, normal fremitus, symmetrical and full respiratory excursions Cardiovascular: normal position and  quality of the apical impulse, regular rhythm, normal first and second heart sounds, no murmurs, rubs or gallops Abdomen: no tenderness or distention, no masses by palpation, no abnormal pulsatility or arterial bruits, normal bowel sounds, no hepatosplenomegaly Extremities: no clubbing, cyanosis or edema; 2+ radial, ulnar and brachial pulses bilaterally; 2+ right femoral, posterior tibial and dorsalis pedis pulses; 2+ left femoral, posterior tibial and dorsalis pedis pulses; no subclavian or femoral bruits Neurological: grossly nonfocal  Psych: euthymic   Wt Readings from Last 3 Encounters:  12/16/16 161 lb (73 kg)  02/11/16 161 lb 3.2 oz (73.1 kg)  12/15/15 160 lb 9.6 oz (72.8 kg)      Studies/Labs Reviewed:   EKG:  EKG is ordered today.  The ekg ordered  today demonstrates Atrial paced, ventricular sensed rhythm. He has a long AV delay of 298 ms. QTC 423 ms. Otherwise normal.    ASSESSMENT:    1. SSS (sick sinus syndrome) ([place])   2. Dyspnea on exertion   3. Nonischemic cardiomyopathy - possibly PVC related   4. NSVT (nonsustained ventricular tachycardia) ([facility])   5. Paroxysmal atrial fibrillation ([place])   6. Pacemaker - dual chamber Medtronic 2011      PLAN:  In order of problems listed above:  1. SSS: Considering his sedentary lifestyle, I think the heart rate histograms/ sensor settings are appropriate.  2. Dyspnea: Echo last year showed EF 50-55%, while the aortic insufficiency, equivocal Doppler evidence of elevated filling pressures. Chest x-ray showed some scarring in the base of the left lung 3. CMP: Low normal left ventricular systolic function. Suspected of having PVC cardiomyopathy, improved over the years. He is on beta blocker therapy. 4. VT: Asymptomatic but relatively lengthy episode of nonsustained VT. I don't think further evaluation or treatment would be indicated in this gentleman. 5. AFib: No atrial fibrillation has been detected in almost 3 years. He is not  taking anticoagulation but is on aspirin 81 mg daily. CHADSVasc 2-3 (age 75, history of CHF but EF is now normal and he does not have clinical heart failure). Felt to be at high risk of falls and injury and warfarin was stopped by his primary care provider a couple of years ago. This was a good decision, he has not had atrial fibrillation in 3 years. 6. Normal pacemaker function. Remote downloads every 3 months and office visit yearly.    Medication Adjustments/Labs and Tests Ordered: Current medicines are reviewed at length with the patient today.  Concerns regarding medicines are outlined above.  Medication changes, Labs and Tests ordered today are listed in the Patient Instructions below. Patient Instructions  Dr Sallyanne Kuster recommends that you continue on your current medications as directed. Please refer to the Current Medication list given to you today.  Remote monitoring is used to monitor your Pacemaker or ICD from home. This monitoring reduces the number of office visits required to check your device to one time per year. It allows [place] to keep an eye on the functioning of your device to ensure it is working properly. You are scheduled for a device check from home on Friday, September 7th, 2018. You may send your transmission at any time that day. If you have a wireless device, the transmission will be sent automatically. After your physician reviews your transmission, you will receive a notification with your next transmission date.  Dr Sallyanne Kuster recommends that you schedule a follow-up appointment in 12 months with a pacemaker check. You will receive a reminder letter in the mail two months in advance. If you don't receive a letter, please call our office to schedule the follow-up appointment.  If you need a refill on your cardiac medications before your next appointment, please call your pharmacy.    Signed, Sanda Klein, MD  12/16/2016 11:30 AM    [facility] [address], [place], [place]  [address] Phone: [phone]; Fax: [phone]

## 2016-12-16 ENCOUNTER — Encounter: Payer: Self-pay | Admitting: Cardiovascular Disease

## 2016-12-16 ENCOUNTER — Ambulatory Visit (INDEPENDENT_AMBULATORY_CARE_PROVIDER_SITE_OTHER): Payer: Medicare Other | Admitting: Cardiovascular Disease

## 2016-12-16 VITALS — BP 147/82 | HR 85 | Ht 66.0 in | Wt 161.0 lb

## 2016-12-16 DIAGNOSIS — I480 Paroxysmal atrial fibrillation: Secondary | ICD-10-CM | POA: Diagnosis not present

## 2016-12-16 DIAGNOSIS — I428 Other cardiomyopathies: Secondary | ICD-10-CM | POA: Diagnosis not present

## 2016-12-16 DIAGNOSIS — I495 Sick sinus syndrome: Secondary | ICD-10-CM | POA: Diagnosis not present

## 2016-12-16 DIAGNOSIS — R0609 Other forms of dyspnea: Secondary | ICD-10-CM

## 2016-12-16 DIAGNOSIS — I472 Ventricular tachycardia: Secondary | ICD-10-CM

## 2016-12-16 DIAGNOSIS — Z950 Presence of cardiac pacemaker: Secondary | ICD-10-CM

## 2016-12-16 DIAGNOSIS — I4729 Other ventricular tachycardia: Secondary | ICD-10-CM | POA: Insufficient documentation

## 2016-12-16 NOTE — Patient Instructions (Signed)

## 2016-12-19 ENCOUNTER — Emergency Department (HOSPITAL_COMMUNITY)
Admission: EM | Admit: 2016-12-19 | Discharge: 2016-12-20 | Disposition: A | Discharge status: Home / Self Care | Payer: Medicare Other | Attending: Emergency Medicine | Admitting: Emergency Medicine

## 2016-12-19 ENCOUNTER — Encounter (HOSPITAL_COMMUNITY): Payer: Self-pay

## 2016-12-19 DIAGNOSIS — Z950 Presence of cardiac pacemaker: Secondary | ICD-10-CM | POA: Diagnosis not present

## 2016-12-19 DIAGNOSIS — I2510 Atherosclerotic heart disease of native coronary artery without angina pectoris: Secondary | ICD-10-CM | POA: Diagnosis not present

## 2016-12-19 DIAGNOSIS — N189 Chronic kidney disease, unspecified: Secondary | ICD-10-CM | POA: Insufficient documentation

## 2016-12-19 DIAGNOSIS — R001 Bradycardia, unspecified: Secondary | ICD-10-CM | POA: Diagnosis not present

## 2016-12-19 DIAGNOSIS — Z7982 Long term (current) use of aspirin: Secondary | ICD-10-CM | POA: Diagnosis not present

## 2016-12-19 DIAGNOSIS — R197 Diarrhea, unspecified: Secondary | ICD-10-CM | POA: Diagnosis not present

## 2016-12-19 DIAGNOSIS — Z7901 Long term (current) use of anticoagulants: Secondary | ICD-10-CM | POA: Diagnosis not present

## 2016-12-19 DIAGNOSIS — Z7722 Contact with and (suspected) exposure to environmental tobacco smoke (acute) (chronic): Secondary | ICD-10-CM | POA: Insufficient documentation

## 2016-12-19 DIAGNOSIS — I130 Hypertensive heart and chronic kidney disease with heart failure and stage 1 through stage 4 chronic kidney disease, or unspecified chronic kidney disease: Secondary | ICD-10-CM | POA: Diagnosis not present

## 2016-12-19 DIAGNOSIS — K2970 Gastritis, unspecified, without bleeding: Secondary | ICD-10-CM | POA: Diagnosis not present

## 2016-12-19 DIAGNOSIS — E860 Dehydration: Secondary | ICD-10-CM | POA: Insufficient documentation

## 2016-12-19 DIAGNOSIS — I509 Heart failure, unspecified: Secondary | ICD-10-CM | POA: Diagnosis not present

## 2016-12-19 DIAGNOSIS — R42 Dizziness and giddiness: Secondary | ICD-10-CM | POA: Diagnosis present

## 2016-12-19 DIAGNOSIS — R110 Nausea: Secondary | ICD-10-CM | POA: Diagnosis not present

## 2016-12-19 LAB — CBC WITH DIFFERENTIAL/PLATELET
Basophils Absolute: 0 10*3/uL (ref 0.0–0.1)
Basophils Relative: 0 %
EOS ABS: 0.1 10*3/uL (ref 0.0–0.7)
EOS PCT: 1 %
HCT: 42 % (ref 39.0–52.0)
Hemoglobin: 13.4 g/dL (ref 13.0–17.0)
LYMPHS ABS: 0.9 10*3/uL (ref 0.7–4.0)
LYMPHS PCT: 7 %
MCH: 29.8 pg (ref 26.0–34.0)
MCHC: 31.9 g/dL (ref 30.0–36.0)
MCV: 93.5 fL (ref 78.0–100.0)
MONO ABS: 0.6 10*3/uL (ref 0.1–1.0)
Monocytes Relative: 5 %
Neutro Abs: 10.5 10*3/uL — ABNORMAL HIGH (ref 1.7–7.7)
Neutrophils Relative %: 87 %
PLATELETS: 195 10*3/uL (ref 150–400)
RBC: 4.49 MIL/uL (ref 4.22–5.81)
RDW: 13.4 % (ref 11.5–15.5)
WBC: 12 10*3/uL — AB (ref 4.0–10.5)

## 2016-12-19 LAB — I-STAT TROPONIN, ED: TROPONIN I, POC: 0 ng/mL (ref 0.00–0.08)

## 2016-12-19 LAB — MAGNESIUM: Magnesium: 1.6 mg/dL — ABNORMAL LOW (ref 1.7–2.4)

## 2016-12-19 NOTE — ED Triage Notes (Signed)
EMS intiitially  Called out for abd pain/upset stomach. On scene pt found to be hypotensive at 70/50, pt given 538ml bolus and BP now 130/80. Pt has pacemaker and going in and out of paced rhythm and some bigeminy on 12 lead. Pt alert and oriented x 4. Denies CP but endorses that he had shob today. VS 130/80, HR 110-130, RR 18, spo2 96% on RA.

## 2016-12-19 NOTE — ED Notes (Signed)
Pacemaker interogation complete

## 2016-12-19 NOTE — ED Provider Notes (Signed)
[facility] DEPT Provider Note   CSN: [id number] Arrival date & time: 12/19/16  2321  By signing my name below, I, Evelene Croon, attest that this documentation has been prepared under the direction and in the presence of Sherwood Gambler, MD . Electronically Signed: Evelene Croon, Scribe. 12/19/2016. 11:47 PM.   History   Chief Complaint Chief Complaint  Patient presents with  . Palpitations    The history is provided by the patient. No language interpreter was used.     HPI Comments:  Samuel Mann Sr. is a 83 y.o. male with a history of PAF, SVT,  HTN, CKD who presents to the Emergency Department via EMS complaining of a 15 minute episode of dizziness that occurred this evening after leaving the bathroom. He went from a seated to standing position. His dizziness has resolved at this time. Pt was given fluids en route. Prior to the dizziness he was experiencing upper abdominal pain ~3pm and this evening ~ 9pm stated he had 2 episodes of diarrhea. No blood seen in his stool. He denies palpitations, CP, SOB, LOC, and vomiting. He also notes EMS told him his pacemaker was not functioning appropriately.    Past Medical History:  Diagnosis Date  . Arthritis   . BPH (benign prostatic hyperplasia)   . CHF (congestive heart failure) ([place])   . CKD (chronic kidney disease)   . Coronary artery disease   . DVT (deep venous thrombosis) ([place])    H/O  . Dyslipidemia   . Hypertension   . Nonischemic cardiomyopathy ([place])   . Osteoarthritis   . Pacemaker   . PAF (paroxysmal atrial fibrillation) ([place])   . PVC's (premature ventricular contractions)    Frequent  . SBO (small bowel obstruction) ([place]) 02/20/2014  . Ventricular arrhythmia     Patient Active Problem List   Diagnosis Date Noted  . NSVT (nonsustained ventricular tachycardia) ([place]) 12/16/2016  . Dyspnea 02/11/2016  . SSS (sick sinus syndrome) ([place]) 12/16/2015  . SBO (small bowel obstruction) ([place]) 02/20/2014  . Small bowel  obstruction ([place]) 02/20/2014  . Pacemaker - dual chamber Medtronic 2011 10/27/2013  . Nonischemic cardiomyopathy - possibly PVC related 10/27/2013  . Hyperlipidemia 10/27/2013  . Atrial fibrillation ([place]) 09/26/2012  . Long term (current) use of anticoagulants 09/26/2012    Past Surgical History:  Procedure Laterality Date  . A-V CARDIAC PACEMAKER INSERTION  02/23/2010   Medtronic  . ABDOMINAL SURGERY  2009  . APPENDECTOMY    . BASAL CELL CARCINOMA EXCISION     from ear  . CARDIAC CATHETERIZATION  04/21/2003   noncritical CAD  . cataract    . NM MYOCAR PERF WALL MOTION  02/12/2010   normal  . PARTIAL COLECTOMY    . TONSILLECTOMY    . US ECHOCARDIOGRAPHY  03/01/2011   pacer induced LBBB,mod. asymmetric LVH, ER 40-45%,mild AI,mild aortic root dilatation, aortic root sclerosis/ca+.  Marland Kitchen VASECTOMY         Home Medications    Prior to Admission medications   Medication Sig Start Date End Date Taking? Authorizing Provider  aspirin EC 81 MG tablet Take 81 mg by mouth daily.   Yes [provider]  Calcium-Vitamin D (CALTRATE 600 PLUS-VIT D PO) Take 600 mg by mouth daily.    Yes [provider]  ipratropium (ATROVENT) 0.03 % nasal spray Place 2 sprays into the nose daily. 09/11/13  Yes [provider]  metoprolol succinate (TOPROL-XL) 50 MG 24 hr tablet Take 1 tablet (50 mg total)  by mouth daily. 09/09/16  Yes Croitoru, Mihai, MD  Multiple Vitamin (MULTIVITAMIN) tablet Take 1 tablet by mouth daily.   Yes [provider]  polyethylene glycol powder (GLYCOLAX/MIRALAX) powder Take 17 g by mouth daily as needed for mild constipation.  05/08/12  Yes Bonk, John-Adam, MD  rosuvastatin (CRESTOR) 20 MG tablet Take 20 mg by mouth daily.   Yes [provider]  Magnesium Oxide -Mg Supplement 250 MG TABS Take 1 tablet (250 mg total) by mouth daily. 12/20/16   Sherwood Gambler, MD    Family History Family History  Problem Relation Age of Onset  . Leukemia  Mother   . CVA Father   . Asthma Father   . Cancer Brother        unsure type  . Heart disease Sister   . Anemia Sister   . Rheumatologic disease Neg Hx     Social History Social History  Substance Use Topics  . Smoking status: Passive Smoke Exposure - Never Smoker  . Smokeless tobacco: Never Used     Comment: At Work  . Alcohol use No     Allergies   Patient has no known allergies.   Review of Systems Review of Systems  Respiratory: Negative for shortness of breath.   Cardiovascular: Negative for chest pain and palpitations.  Gastrointestinal: Positive for abdominal pain and diarrhea. Negative for vomiting.  Neurological: Positive for dizziness. Negative for syncope.  All other systems reviewed and are negative.    Physical Exam Updated Vital Signs BP 124/68   Pulse 71   Temp 97.2 F (36.2 C) (Oral)   Resp 16   Ht 5\' 6"  (1.676 m)   Wt 70.3 kg (155 lb)   SpO2 97%   BMI 25.02 kg/m   Physical Exam  Constitutional: He is oriented to person, place, and time. He appears well-developed and well-nourished.  HENT:  Head: Normocephalic and atraumatic.  Right Ear: External ear normal.  Left Ear: External ear normal.  Nose: Nose normal.  Eyes: Right eye exhibits no discharge. Left eye exhibits no discharge.  Neck: Neck supple.  Cardiovascular: Regular rhythm and normal heart sounds.  Bradycardia present.   Pulses:      Radial pulses are 2+ on the right side, and 2+ on the left side.  Pulmonary/Chest: Effort normal and breath sounds normal.  Abdominal: Soft. There is no tenderness.  Musculoskeletal: He exhibits no edema.  Neurological: He is alert and oriented to person, place, and time.  CN 3-12 grossly intact. 5/5 strength in all 4 extremities. Grossly normal sensation. Normal finger to nose.   Skin: Skin is warm and dry. He is not diaphoretic.  Nursing note and vitals reviewed.    ED Treatments / Results  DIAGNOSTIC STUDIES:  Oxygen Saturation is 96% on  RA, normal[p by my interpretation.    COORDINATION OF CARE:  11:37 PM Discussed treatment plan with pt at bedside and pt agreed to plan.  Labs (all labs ordered are listed, but only abnormal results are displayed) Labs Reviewed  MAGNESIUM - Abnormal; Notable for the following:       Result Value   Magnesium 1.6 (*)    All other components within normal limits  COMPREHENSIVE METABOLIC PANEL - Abnormal; Notable for the following:    Glucose, Bld 148 (*)    BUN 23 (*)    Creatinine, Ser 1.35 (*)    Calcium 8.5 (*)    Total Protein 6.1 (*)    ALT 11 (*)  GFR calc non Af Amer 44 (*)    GFR calc Af Amer 50 (*)    All other components within normal limits  CBC WITH DIFFERENTIAL/PLATELET - Abnormal; Notable for the following:    WBC 12.0 (*)    Neutro Abs 10.5 (*)    All other components within normal limits  LIPASE, BLOOD  I-STAT TROPOININ, ED    EKG  EKG Interpretation  Date/Time:  Monday December 19 2016 23:22:32 EDT Ventricular Rate:  80 PR Interval:    QRS Duration: 134 QT Interval:  430 QTC Calculation: 497 R Axis:   -88 Text Interpretation:  Sinus rhythm Multiform ventricular premature complexes Short PR interval Nonspecific IVCD with LAD Anterolateral infarct, old Probable RV involvement, suggest recording right precordial leads LBBB new compared to 2015 Confirmed by Sherwood Gambler [phone]) on 12/19/2016 11:25:39 PM       Radiology No results found.  Procedures Procedures (including critical care time)  Medications Ordered in ED Medications  magnesium sulfate IVPB 1 g 100 mL (1 g Intravenous New Bag/Given 12/20/16 0019)  sodium chloride 0.9 % bolus 500 mL (0 mLs Intravenous Stopped 12/20/16 0153)     Initial Impression / Assessment and Plan / ED Course  I have reviewed the triage vital signs and the nursing notes.  Pertinent labs & imaging results that were available during my care of the patient were reviewed by me and considered in my medical decision making  (see chart for details).     Patient is asymptomatic from PVCs and intermittent bigeminy. Pacemaker interrogation shows no acute events. He has demand pacing which is likely why EMS saw that his pacemaker seemed to be "not working". He feels much better. Cr mildly bumped but not c/w AKI. Likely mild dehydration. Mag a little low, repleted in ED, will d/c with mag-ox. Likely this is from diarrhea. His abd pain was likely related to the diarrhea, none now. Doubt ACS. No chest/palpitations/dyspnea symptoms. Able to ambulate without difficulty. F/u with PCP.  Final Clinical Impressions(s) / ED Diagnoses   Final diagnoses:  Diarrhea, unspecified type  Dehydration  Hypomagnesemia    New Prescriptions New Prescriptions   MAGNESIUM OXIDE -MG SUPPLEMENT 250 MG TABS    Take 1 tablet (250 mg total) by mouth daily.   I personally performed the services described in this documentation, which was scribed in my presence. The recorded information has been reviewed and is accurate.     Sherwood Gambler, MD 12/20/16 [phone]

## 2016-12-20 DIAGNOSIS — E860 Dehydration: Secondary | ICD-10-CM | POA: Diagnosis not present

## 2016-12-20 LAB — COMPREHENSIVE METABOLIC PANEL
ALK PHOS: 84 U/L (ref 38–126)
ALT: 11 U/L — ABNORMAL LOW (ref 17–63)
ANION GAP: 8 (ref 5–15)
AST: 23 U/L (ref 15–41)
Albumin: 3.7 g/dL (ref 3.5–5.0)
BUN: 23 mg/dL — ABNORMAL HIGH (ref 6–20)
CALCIUM: 8.5 mg/dL — AB (ref 8.9–10.3)
CO2: 22 mmol/L (ref 22–32)
CREATININE: 1.35 mg/dL — AB (ref 0.61–1.24)
Chloride: 111 mmol/L (ref 101–111)
GFR, EST AFRICAN AMERICAN: 50 mL/min — AB (ref >60)
GFR, EST NON AFRICAN AMERICAN: 44 mL/min — AB (ref >60)
Glucose, Bld: 148 mg/dL — ABNORMAL HIGH (ref 65–99)
Potassium: 4.8 mmol/L (ref 3.5–5.1)
Sodium: 141 mmol/L (ref 135–145)
TOTAL PROTEIN: 6.1 g/dL — AB (ref 6.5–8.1)
Total Bilirubin: 0.8 mg/dL (ref 0.3–1.2)

## 2016-12-20 LAB — LIPASE, BLOOD: LIPASE: 26 U/L (ref 11–51)

## 2016-12-20 MED ORDER — MAGNESIUM SULFATE IN D5W 1-5 GM/100ML-% IV SOLN
1.0000 g | Freq: Once | INTRAVENOUS | Status: AC
  Administered 2016-12-20: 1 g via INTRAVENOUS
  Filled 2016-12-20: qty 100

## 2016-12-20 MED ORDER — MAGNESIUM OXIDE -MG SUPPLEMENT 250 MG PO TABS: 250.0000 mg | ORAL_TABLET | Freq: Every day | ORAL | 0 refills | Status: DC

## 2016-12-20 MED ORDER — SODIUM CHLORIDE 0.9 % IV BOLUS (SEPSIS)
500.0000 mL | Freq: Once | INTRAVENOUS | Status: AC
  Administered 2016-12-20: 500 mL via INTRAVENOUS

## 2016-12-20 NOTE — ED Notes (Signed)
Pt verbalized understanding of d/c instructions and has no further questions. Pt stable and NAD. VSS. Pt in NSR. Pt provided with cab voucher for ride home.

## 2016-12-20 NOTE — ED Notes (Signed)
Pt ambulated to the end of the hall and back independently.

## 2016-12-20 NOTE — ED Notes (Signed)
Dr Goldston in room.  

## 2016-12-20 NOTE — ED Notes (Addendum)
Tom at [employer] called this Therapist, sports. Most recent high ventricular rate 12/02/16. None today. Episode log being sent. Pt is ventricular paced most of the time and has a demand pacemaker.

## 2016-12-26 DIAGNOSIS — N289 Disorder of kidney and ureter, unspecified: Secondary | ICD-10-CM | POA: Diagnosis not present

## 2016-12-26 DIAGNOSIS — D72829 Elevated white blood cell count, unspecified: Secondary | ICD-10-CM | POA: Diagnosis not present

## 2016-12-26 DIAGNOSIS — R197 Diarrhea, unspecified: Secondary | ICD-10-CM | POA: Diagnosis not present

## 2017-01-06 DIAGNOSIS — M40204 Unspecified kyphosis, thoracic region: Secondary | ICD-10-CM | POA: Diagnosis not present

## 2017-01-06 DIAGNOSIS — R2689 Other abnormalities of gait and mobility: Secondary | ICD-10-CM | POA: Diagnosis not present

## 2017-01-06 DIAGNOSIS — R293 Abnormal posture: Secondary | ICD-10-CM | POA: Diagnosis not present

## 2017-01-09 DIAGNOSIS — R293 Abnormal posture: Secondary | ICD-10-CM | POA: Diagnosis not present

## 2017-01-09 DIAGNOSIS — M40204 Unspecified kyphosis, thoracic region: Secondary | ICD-10-CM | POA: Diagnosis not present

## 2017-01-09 DIAGNOSIS — R2689 Other abnormalities of gait and mobility: Secondary | ICD-10-CM | POA: Diagnosis not present

## 2017-01-12 DIAGNOSIS — M40204 Unspecified kyphosis, thoracic region: Secondary | ICD-10-CM | POA: Diagnosis not present

## 2017-01-12 DIAGNOSIS — R293 Abnormal posture: Secondary | ICD-10-CM | POA: Diagnosis not present

## 2017-01-12 DIAGNOSIS — R2689 Other abnormalities of gait and mobility: Secondary | ICD-10-CM | POA: Diagnosis not present

## 2017-01-17 DIAGNOSIS — R293 Abnormal posture: Secondary | ICD-10-CM | POA: Diagnosis not present

## 2017-01-17 DIAGNOSIS — R2689 Other abnormalities of gait and mobility: Secondary | ICD-10-CM | POA: Diagnosis not present

## 2017-01-17 DIAGNOSIS — M40204 Unspecified kyphosis, thoracic region: Secondary | ICD-10-CM | POA: Diagnosis not present

## 2017-01-19 DIAGNOSIS — H1859 Other hereditary corneal dystrophies: Secondary | ICD-10-CM | POA: Diagnosis not present

## 2017-01-20 DIAGNOSIS — D3131 Benign neoplasm of right choroid: Secondary | ICD-10-CM | POA: Diagnosis not present

## 2017-01-20 DIAGNOSIS — H1859 Other hereditary corneal dystrophies: Secondary | ICD-10-CM | POA: Diagnosis not present

## 2017-01-20 DIAGNOSIS — D3132 Benign neoplasm of left choroid: Secondary | ICD-10-CM | POA: Diagnosis not present

## 2017-01-20 DIAGNOSIS — H40003 Preglaucoma, unspecified, bilateral: Secondary | ICD-10-CM | POA: Diagnosis not present

## 2017-01-23 DIAGNOSIS — D3131 Benign neoplasm of right choroid: Secondary | ICD-10-CM | POA: Diagnosis not present

## 2017-01-23 DIAGNOSIS — D3132 Benign neoplasm of left choroid: Secondary | ICD-10-CM | POA: Diagnosis not present

## 2017-01-23 DIAGNOSIS — H1859 Other hereditary corneal dystrophies: Secondary | ICD-10-CM | POA: Diagnosis not present

## 2017-01-23 DIAGNOSIS — H40003 Preglaucoma, unspecified, bilateral: Secondary | ICD-10-CM | POA: Diagnosis not present

## 2017-01-27 DIAGNOSIS — H40003 Preglaucoma, unspecified, bilateral: Secondary | ICD-10-CM | POA: Diagnosis not present

## 2017-01-27 DIAGNOSIS — H1859 Other hereditary corneal dystrophies: Secondary | ICD-10-CM | POA: Diagnosis not present

## 2017-01-27 DIAGNOSIS — D3132 Benign neoplasm of left choroid: Secondary | ICD-10-CM | POA: Diagnosis not present

## 2017-01-27 DIAGNOSIS — D3131 Benign neoplasm of right choroid: Secondary | ICD-10-CM | POA: Diagnosis not present

## 2017-02-03 DIAGNOSIS — R293 Abnormal posture: Secondary | ICD-10-CM | POA: Diagnosis not present

## 2017-02-03 DIAGNOSIS — M40204 Unspecified kyphosis, thoracic region: Secondary | ICD-10-CM | POA: Diagnosis not present

## 2017-02-03 DIAGNOSIS — R2689 Other abnormalities of gait and mobility: Secondary | ICD-10-CM | POA: Diagnosis not present

## 2017-02-07 DIAGNOSIS — M40204 Unspecified kyphosis, thoracic region: Secondary | ICD-10-CM | POA: Diagnosis not present

## 2017-02-07 DIAGNOSIS — R293 Abnormal posture: Secondary | ICD-10-CM | POA: Diagnosis not present

## 2017-02-07 DIAGNOSIS — R2689 Other abnormalities of gait and mobility: Secondary | ICD-10-CM | POA: Diagnosis not present

## 2017-02-08 LAB — CUP PACEART INCLINIC DEVICE CHECK
Battery Impedance: 425 Ohm
Battery Remaining Longevity: 101 mo
Brady Statistic AP VP Percent: 1 %
Implantable Lead Implant Date: 20070421
Implantable Lead Location: 753860
Implantable Pulse Generator Implant Date: 20110816
Lead Channel Impedance Value: 424 Ohm
Lead Channel Pacing Threshold Amplitude: 0.625 V
Lead Channel Setting Pacing Amplitude: 1.5 V
Lead Channel Setting Pacing Amplitude: 2 V
Lead Channel Setting Sensing Sensitivity: 4 mV
MDC IDC LEAD IMPLANT DT: 20070421
MDC IDC LEAD LOCATION: 753859
MDC IDC MSMT BATTERY VOLTAGE: 2.78 V
MDC IDC MSMT LEADCHNL RA PACING THRESHOLD PULSEWIDTH: 0.4 ms
MDC IDC MSMT LEADCHNL RV IMPEDANCE VALUE: 639 Ohm
MDC IDC MSMT LEADCHNL RV PACING THRESHOLD AMPLITUDE: 0.75 V
MDC IDC MSMT LEADCHNL RV PACING THRESHOLD PULSEWIDTH: 0.4 ms
MDC IDC SESS DTM: 20180608131700
MDC IDC SET LEADCHNL RV PACING PULSEWIDTH: 0.4 ms
MDC IDC STAT BRADY AP VS PERCENT: 97 %
MDC IDC STAT BRADY AS VP PERCENT: 0 %
MDC IDC STAT BRADY AS VS PERCENT: 2 %

## 2017-02-14 DIAGNOSIS — M40204 Unspecified kyphosis, thoracic region: Secondary | ICD-10-CM | POA: Diagnosis not present

## 2017-02-14 DIAGNOSIS — R2689 Other abnormalities of gait and mobility: Secondary | ICD-10-CM | POA: Diagnosis not present

## 2017-02-14 DIAGNOSIS — R293 Abnormal posture: Secondary | ICD-10-CM | POA: Diagnosis not present

## 2017-02-16 DIAGNOSIS — H527 Unspecified disorder of refraction: Secondary | ICD-10-CM | POA: Diagnosis not present

## 2017-02-16 DIAGNOSIS — R293 Abnormal posture: Secondary | ICD-10-CM | POA: Diagnosis not present

## 2017-02-16 DIAGNOSIS — H1859 Other hereditary corneal dystrophies: Secondary | ICD-10-CM | POA: Diagnosis not present

## 2017-02-16 DIAGNOSIS — R2689 Other abnormalities of gait and mobility: Secondary | ICD-10-CM | POA: Diagnosis not present

## 2017-02-16 DIAGNOSIS — M40204 Unspecified kyphosis, thoracic region: Secondary | ICD-10-CM | POA: Diagnosis not present

## 2017-02-21 DIAGNOSIS — M40204 Unspecified kyphosis, thoracic region: Secondary | ICD-10-CM | POA: Diagnosis not present

## 2017-02-21 DIAGNOSIS — R2689 Other abnormalities of gait and mobility: Secondary | ICD-10-CM | POA: Diagnosis not present

## 2017-02-21 DIAGNOSIS — R293 Abnormal posture: Secondary | ICD-10-CM | POA: Diagnosis not present

## 2017-02-23 DIAGNOSIS — M40204 Unspecified kyphosis, thoracic region: Secondary | ICD-10-CM | POA: Diagnosis not present

## 2017-02-23 DIAGNOSIS — R2689 Other abnormalities of gait and mobility: Secondary | ICD-10-CM | POA: Diagnosis not present

## 2017-02-23 DIAGNOSIS — R293 Abnormal posture: Secondary | ICD-10-CM | POA: Diagnosis not present

## 2017-02-28 DIAGNOSIS — R293 Abnormal posture: Secondary | ICD-10-CM | POA: Diagnosis not present

## 2017-02-28 DIAGNOSIS — R2689 Other abnormalities of gait and mobility: Secondary | ICD-10-CM | POA: Diagnosis not present

## 2017-02-28 DIAGNOSIS — M40204 Unspecified kyphosis, thoracic region: Secondary | ICD-10-CM | POA: Diagnosis not present

## 2017-03-02 DIAGNOSIS — M40204 Unspecified kyphosis, thoracic region: Secondary | ICD-10-CM | POA: Diagnosis not present

## 2017-03-02 DIAGNOSIS — R2689 Other abnormalities of gait and mobility: Secondary | ICD-10-CM | POA: Diagnosis not present

## 2017-03-02 DIAGNOSIS — R293 Abnormal posture: Secondary | ICD-10-CM | POA: Diagnosis not present

## 2017-03-07 DIAGNOSIS — R2689 Other abnormalities of gait and mobility: Secondary | ICD-10-CM | POA: Diagnosis not present

## 2017-03-07 DIAGNOSIS — R293 Abnormal posture: Secondary | ICD-10-CM | POA: Diagnosis not present

## 2017-03-07 DIAGNOSIS — M40204 Unspecified kyphosis, thoracic region: Secondary | ICD-10-CM | POA: Diagnosis not present

## 2017-03-09 DIAGNOSIS — M40204 Unspecified kyphosis, thoracic region: Secondary | ICD-10-CM | POA: Diagnosis not present

## 2017-03-09 DIAGNOSIS — R293 Abnormal posture: Secondary | ICD-10-CM | POA: Diagnosis not present

## 2017-03-09 DIAGNOSIS — R2689 Other abnormalities of gait and mobility: Secondary | ICD-10-CM | POA: Diagnosis not present

## 2017-03-14 DIAGNOSIS — R2689 Other abnormalities of gait and mobility: Secondary | ICD-10-CM | POA: Diagnosis not present

## 2017-03-14 DIAGNOSIS — R293 Abnormal posture: Secondary | ICD-10-CM | POA: Diagnosis not present

## 2017-03-14 DIAGNOSIS — M40204 Unspecified kyphosis, thoracic region: Secondary | ICD-10-CM | POA: Diagnosis not present

## 2017-03-16 DIAGNOSIS — M40204 Unspecified kyphosis, thoracic region: Secondary | ICD-10-CM | POA: Diagnosis not present

## 2017-03-16 DIAGNOSIS — R293 Abnormal posture: Secondary | ICD-10-CM | POA: Diagnosis not present

## 2017-03-16 DIAGNOSIS — R2689 Other abnormalities of gait and mobility: Secondary | ICD-10-CM | POA: Diagnosis not present

## 2017-03-17 ENCOUNTER — Ambulatory Visit (INDEPENDENT_AMBULATORY_CARE_PROVIDER_SITE_OTHER): Payer: Medicare Other | Admitting: *Deleted

## 2017-03-17 DIAGNOSIS — I495 Sick sinus syndrome: Secondary | ICD-10-CM | POA: Diagnosis not present

## 2017-03-17 NOTE — Progress Notes (Signed)
Remote pacemaker transmission.   

## 2017-03-20 ENCOUNTER — Ambulatory Visit (INDEPENDENT_AMBULATORY_CARE_PROVIDER_SITE_OTHER): Payer: Medicare Other | Admitting: Physician Assistant

## 2017-03-20 ENCOUNTER — Encounter: Payer: Self-pay | Admitting: Physician Assistant

## 2017-03-20 VITALS — BP 165/97 | HR 102 | Ht 66.0 in | Wt 162.2 lb

## 2017-03-20 DIAGNOSIS — I495 Sick sinus syndrome: Secondary | ICD-10-CM

## 2017-03-20 DIAGNOSIS — I480 Paroxysmal atrial fibrillation: Secondary | ICD-10-CM

## 2017-03-20 DIAGNOSIS — I428 Other cardiomyopathies: Secondary | ICD-10-CM | POA: Diagnosis not present

## 2017-03-20 NOTE — Progress Notes (Signed)
Thanks, MCr 

## 2017-03-20 NOTE — Patient Instructions (Signed)
Medication Instructions:  NO CHANGES Your physician recommends that you continue on your current medications as directed. Please refer to the Current Medication list given to you today. If you need a refill on your cardiac medications before your next appointment, please call your pharmacy.  Labwork: WITH DR [facility]  Follow-Up: Your physician wants you to follow-up in: AS PLANNED IN June 2019 WITH DR [place].  AND MAKE SURE TO TRANSMIT PACER AT HOME 06-19-2017 @ 845AM  Special Instructions: ENCOURAGE TO INCREASE ACTIVITY AS TOLERATED CONTINUE TO TRACK WEIGHT AND BLOOD PRESSURE GET COMPRESSION STOCKINGS AND WEAR DAILY  Thank you for choosing [facility] at [facility]!!

## 2017-03-20 NOTE — Progress Notes (Addendum)
Cardiology Office Note   Date:  03/20/2017   ID:  Samuel Running Cleckley Sr., DOB 1922-03-27, MRN [id number]  PCP:  Deland Pretty, MD  Cardiologist:  Dr. Sallyanne Kuster, 12/16/2016  Rosaria Ferries, PA-C    History of Present Illness: Samuel Hartwell Dunavan Sr. is a 83 y.o. male with a history of NICM (presumed PVC-related), sinus node dysfunction s/p MDT dual-chamber PPM, PAF, HLD, CKD IIII, DVT, HTN, BPH, CHADS2VASC=7 (age x 2, HTN, CAD, DVT x 2, CHF), the patient is not anticoagulated secondary to age and frailty  ER visit 12/19/2016 for palpitations associated with presyncope, PVCs and intermittent bigeminy noted, magnesium was low and was supplemented, discharged on Mag-Ox, patient with recent history of diarrhea  Samuel Viloria Bodnar Sr. presents for cardiology follow up.  His feet and legs have been swelling a little. He never gets chest pain.  He is no longer on the Magnesium. Dr Shelia Media took him off. He never feels his heart skip. He never gets light-headed or dizzy.   He is going to PT for posture issues. They are trying to get him to stand straighter and get his shoulders back. He does shoulder exercises and stretching.   He does wood-working, that is likely the reason for his shoulders drooping. That is also the most strenuous thing he does.   He is not waking with LE edema, but gets it throughout the day. His weight is up a little, on his scales, it is sometimes 158.5. This is up 2 or 3 pounds, but the increase has been gradual. He denies orthopnea or PND. He gets DOE when he bends over, otherwise does not notice it. This is not a recent change. He and his wife rarely eat out or eat prepared foods. They do most of their own cooking. He does not feel he gets too much salt in his food and does not over-drink.  His BP/HR are high today in the office, but his BP does not run high at home. He had just walked back to the room, and had gotten a phone call when his VS were taken. His BP at home today was 134/68.  That is a normal reading for him at home.     Past Medical History:  Diagnosis Date  . Arthritis   . BPH (benign prostatic hyperplasia)   . CHF (congestive heart failure) ([place])   . CKD (chronic kidney disease)   . Coronary artery disease   . DVT (deep venous thrombosis) ([place])    H/O  . Dyslipidemia   . Hypertension   . Nonischemic cardiomyopathy ([place])   . Osteoarthritis   . Pacemaker   . PAF (paroxysmal atrial fibrillation) ([place])   . PVC's (premature ventricular contractions)    Frequent  . SBO (small bowel obstruction) ([place]) 02/20/2014  . Ventricular arrhythmia     Past Surgical History:  Procedure Laterality Date  . A-V CARDIAC PACEMAKER INSERTION  02/23/2010   Medtronic  . ABDOMINAL SURGERY  2009  . APPENDECTOMY    . BASAL CELL CARCINOMA EXCISION     from ear  . CARDIAC CATHETERIZATION  04/21/2003   noncritical CAD  . cataract    . NM MYOCAR PERF WALL MOTION  02/12/2010   normal  . PARTIAL COLECTOMY    . TONSILLECTOMY    . US ECHOCARDIOGRAPHY  03/01/2011   pacer induced LBBB,mod. asymmetric LVH, ER 40-45%,mild AI,mild aortic root dilatation, aortic root sclerosis/ca+.  Marland Kitchen VASECTOMY      Current Outpatient Prescriptions  Medication Sig Dispense Refill  . aspirin EC 81 MG tablet Take 81 mg by mouth daily.    . Calcium-Vitamin D (CALTRATE 600 PLUS-VIT D PO) Take 600 mg by mouth daily.     Marland Kitchen ipratropium (ATROVENT) 0.03 % nasal spray Place 2 sprays into the nose daily.    . metoprolol succinate (TOPROL-XL) 50 MG 24 hr tablet Take 1 tablet (50 mg total) by mouth daily. 90 tablet 2  . Multiple Vitamin (MULTIVITAMIN) tablet Take 1 tablet by mouth daily.    . polyethylene glycol powder (GLYCOLAX/MIRALAX) powder Take 17 g by mouth daily as needed for mild constipation.     . rosuvastatin (CRESTOR) 20 MG tablet Take 20 mg by mouth daily.     No current facility-administered medications for this visit.     Allergies:   Patient has no known allergies.    Social History:   The patient  reports that he is a non-smoker but has been exposed to tobacco smoke. He has never used smokeless tobacco. He reports that he does not drink alcohol or use drugs.   Family History:  The patient's family history includes Anemia in his sister; Asthma in his father; CVA in his father; Cancer in his brother; Heart disease in his sister; Leukemia in his mother.    ROS:  Please see the history of present illness. All other systems are reviewed and negative.    PHYSICAL EXAM: VS:  BP (!) 165/97   Pulse (!) 102   Ht 5\' 6"  (1.676 m)   Wt 162 lb 3.2 oz (73.6 kg)   SpO2 98%   BMI 26.18 kg/m  , BMI Body mass index is 26.18 kg/m. GEN: Well nourished, well developed, male in no acute distress  HEENT: normal for age  Neck: no JVD, no carotid bruit, no masses Cardiac: RRR; no murmur, no rubs, or gallops Respiratory:  clear to auscultation bilaterally, normal work of breathing GI: soft, nontender, nondistended, + BS MS: no deformity or atrophy; 1+ pedal edema; distal pulses are 2+ in all 4 extremities   Skin: warm and dry, no rash Neuro:  Strength and sensation are intact Psych: euthymic mood, full affect   EKG:  EKG is not ordered today  ECHO: 01/01/2016 - Left ventricle: The cavity size was mildly dilated. Wall   thickness was increased in a pattern of mild LVH. Systolic   function was normal. The estimated ejection fraction was in the   range of 50% to 55%. Wall motion was normal; there were no   regional wall motion abnormalities. Doppler parameters are   consistent with abnormal left ventricular relaxation (grade 1   diastolic dysfunction). - Aortic valve: There was mild regurgitation.   Recent Labs: 12/19/2016: ALT 11; BUN 23; Creatinine, Ser 1.35; Hemoglobin 13.4; Magnesium 1.6; Platelets 195; Potassium 4.8; Sodium 141    Lipid Panel No results found for: CHOL, TRIG, HDL, CHOLHDL, VLDL, LDLCALC, LDLDIRECT   Wt Readings from Last 3 Encounters:  03/20/17 162 lb 3.2  oz (73.6 kg)  12/19/16 155 lb (70.3 kg)  12/16/16 161 lb (73 kg)     Other studies Reviewed: Additional studies/ records that were reviewed today include: Office records, hospital records and previous testing.  ASSESSMENT AND PLAN:  1.  Lower extremity edema: He seems to get this mostly during the day and says he is not waking with it. He does not otherwise have any signs or symptoms of volume overload on exam. He does not have significant dietary  indiscretions regarding salt or fluid. At this time, I will not add a diuretic. He is encouraged to contact us if he starts waking up with lower extremity edema or his dyspnea on exertion gets any worse. We could add a low-dose diuretic at that time. He seems to sit a lot at the time when he is at home. He is encouraged to increase his activity as tolerated. He is encouraged to get support or compression socks.  2. Hypertension: He was hypertensive here in the office today but had just exerted himself significantly and got a phone call that may have been upsetting to him. The patient states that his blood pressure is well-controlled at home with systolics usually in the 838F and diastolics in the 84C. No medication changes.  3. Hyperlipidemia: Cyst managed by Dr. Shelia Media. We will try to obtain labs from his office.  4. Sick sinus syndrome: His heartbeat is slightly irregular in the office, but he is unaware of. He is compliant with his bone pacemaker checks. Follow-up as scheduled.   Current medicines are reviewed at length with the patient today.  The patient does not have concerns regarding medicines.  The following changes have been made:  no change  Labs/ tests ordered today include:  No orders of the defined types were placed in this encounter.    Disposition:   FU with Dr. Sallyanne Kuster  Signed, Rosaria Ferries, PA-C  03/20/2017 1:50 PM    [facility] Phone: [phone]; Fax: [phone]  This note was written  with the assistance of speech recognition software. Please excuse any transcriptional errors.  ADDENDUM: After the visit was completed, his PPM interrogation report became available. He is AP-VS 96.9% of the time. No high atrial rate episodes since 2016. High ventricular rate episodes happen occasionally, they generally last only a few seconds. There have been more than 1 million PVCs since 2015. Dr. Sallyanne Kuster is to review this and decide on any changes that need to be made. This was given to Shelly>>to give to him.  Rosaria Ferries, Hershal Coria 03/20/2017 4:19 PM Beeper [phone]

## 2017-03-21 ENCOUNTER — Encounter: Payer: Self-pay | Admitting: Cardiology

## 2017-03-21 DIAGNOSIS — R2689 Other abnormalities of gait and mobility: Secondary | ICD-10-CM | POA: Diagnosis not present

## 2017-03-21 DIAGNOSIS — R293 Abnormal posture: Secondary | ICD-10-CM | POA: Diagnosis not present

## 2017-03-21 DIAGNOSIS — M40204 Unspecified kyphosis, thoracic region: Secondary | ICD-10-CM | POA: Diagnosis not present

## 2017-03-24 DIAGNOSIS — M40204 Unspecified kyphosis, thoracic region: Secondary | ICD-10-CM | POA: Diagnosis not present

## 2017-03-24 DIAGNOSIS — R293 Abnormal posture: Secondary | ICD-10-CM | POA: Diagnosis not present

## 2017-03-24 DIAGNOSIS — R2689 Other abnormalities of gait and mobility: Secondary | ICD-10-CM | POA: Diagnosis not present

## 2017-03-28 DIAGNOSIS — R293 Abnormal posture: Secondary | ICD-10-CM | POA: Diagnosis not present

## 2017-03-28 DIAGNOSIS — R2689 Other abnormalities of gait and mobility: Secondary | ICD-10-CM | POA: Diagnosis not present

## 2017-03-28 DIAGNOSIS — M40204 Unspecified kyphosis, thoracic region: Secondary | ICD-10-CM | POA: Diagnosis not present

## 2017-04-04 DIAGNOSIS — R293 Abnormal posture: Secondary | ICD-10-CM | POA: Diagnosis not present

## 2017-04-04 DIAGNOSIS — R2689 Other abnormalities of gait and mobility: Secondary | ICD-10-CM | POA: Diagnosis not present

## 2017-04-04 DIAGNOSIS — M40204 Unspecified kyphosis, thoracic region: Secondary | ICD-10-CM | POA: Diagnosis not present

## 2017-04-07 LAB — CUP PACEART REMOTE DEVICE CHECK
Brady Statistic AP VP Percent: 1 %
Brady Statistic AP VS Percent: 97 %
Brady Statistic AS VP Percent: 0 %
Implantable Lead Implant Date: 20070421
Implantable Lead Location: 753859
Implantable Lead Model: 5092
Lead Channel Pacing Threshold Amplitude: 0.625 V
Lead Channel Pacing Threshold Amplitude: 0.75 V
Lead Channel Pacing Threshold Pulse Width: 0.4 ms
Lead Channel Sensing Intrinsic Amplitude: 8 mV
Lead Channel Setting Pacing Amplitude: 1.5 V
Lead Channel Setting Sensing Sensitivity: 4 mV
MDC IDC LEAD IMPLANT DT: 20070421
MDC IDC LEAD LOCATION: 753860
MDC IDC MSMT BATTERY IMPEDANCE: 475 Ohm
MDC IDC MSMT BATTERY REMAINING LONGEVITY: 97 mo
MDC IDC MSMT BATTERY VOLTAGE: 2.79 V
MDC IDC MSMT LEADCHNL RA IMPEDANCE VALUE: 423 Ohm
MDC IDC MSMT LEADCHNL RA PACING THRESHOLD PULSEWIDTH: 0.4 ms
MDC IDC MSMT LEADCHNL RV IMPEDANCE VALUE: 637 Ohm
MDC IDC PG IMPLANT DT: 20110816
MDC IDC SESS DTM: 20180907134341
MDC IDC SET LEADCHNL RV PACING AMPLITUDE: 2 V
MDC IDC SET LEADCHNL RV PACING PULSEWIDTH: 0.4 ms
MDC IDC STAT BRADY AS VS PERCENT: 2 %

## 2017-04-27 ENCOUNTER — Ambulatory Visit (INDEPENDENT_AMBULATORY_CARE_PROVIDER_SITE_OTHER): Payer: Medicare Other | Admitting: Cardiology

## 2017-04-27 ENCOUNTER — Encounter: Payer: Self-pay | Admitting: Cardiology

## 2017-04-27 DIAGNOSIS — Z950 Presence of cardiac pacemaker: Secondary | ICD-10-CM

## 2017-04-27 DIAGNOSIS — I428 Other cardiomyopathies: Secondary | ICD-10-CM | POA: Diagnosis not present

## 2017-04-27 DIAGNOSIS — R600 Localized edema: Secondary | ICD-10-CM | POA: Insufficient documentation

## 2017-04-27 MED ORDER — FUROSEMIDE 20 MG PO TABS: 20.0000 mg | ORAL_TABLET | Freq: Every day | ORAL | 3 refills | Status: DC | PRN

## 2017-04-27 NOTE — Patient Instructions (Signed)
Samuel Mann, [place], has recommended making the following medication changes: 1. START Furosemide 20 mg - take 1 tablet as needed for swelling, up to 3 times per week  Your physician recommends that you schedule a follow-up appointment in 3 months with Dr Sallyanne Kuster.  If you need a refill on your cardiac medications before your next appointment, please call your pharmacy.

## 2017-04-27 NOTE — Progress Notes (Signed)
04/27/2017 Samuel Gilbert Debruler Sr.   May 02, 1922  [id number]  Primary Physician Deland Pretty, MD Primary Cardiologist: Dr Sallyanne Kuster  HPI:  This is the second office visit for Mr Samuel Mann with complaints of LE edema. He does not have CHF by history or by exam. He complains of swelling above his socks and at his ankles. On exam this is not impressive- trace to 1+ today in the office. He otherwise feels well. Per prior notes he follows a low sodium diet.    Current Outpatient Prescriptions  Medication Sig Dispense Refill  . aspirin EC 81 MG tablet Take 81 mg by mouth daily.    . Calcium-Vitamin D (CALTRATE 600 PLUS-VIT D PO) Take 600 mg by mouth daily.     Marland Kitchen ipratropium (ATROVENT) 0.03 % nasal spray Place 2 sprays into the nose daily.    . metoprolol succinate (TOPROL-XL) 50 MG 24 hr tablet Take 1 tablet (50 mg total) by mouth daily. 90 tablet 2  . Multiple Vitamin (MULTIVITAMIN) tablet Take 1 tablet by mouth daily.    . rosuvastatin (CRESTOR) 20 MG tablet Take 20 mg by mouth daily.    . furosemide (LASIX) 20 MG tablet Take 1 tablet (20 mg total) by mouth daily as needed for edema. Up to 3 times per week. 30 tablet 3   No current facility-administered medications for this visit.     No Known Allergies  Past Medical History:  Diagnosis Date  . Arthritis   . BPH (benign prostatic hyperplasia)   . CHF (congestive heart failure) ([place])   . CKD (chronic kidney disease)   . Coronary artery disease   . DVT (deep venous thrombosis) ([place])    H/O  . Dyslipidemia   . Hypertension   . Nonischemic cardiomyopathy ([place])   . Osteoarthritis   . Pacemaker   . PAF (paroxysmal atrial fibrillation) ([place])   . PVC's (premature ventricular contractions)    Frequent  . SBO (small bowel obstruction) ([place]) 02/20/2014  . Ventricular arrhythmia     Social History   Social History  . Marital status: Married    Spouse name: N/A  . Number of children: 2  . Years of education: N/A   Occupational History    . retired    Social History Main Topics  . Smoking status: Passive Smoke Exposure - Never Smoker  . Smokeless tobacco: Never Used     Comment: At Work  . Alcohol use No  . Drug use: No  . Sexual activity: Not on file   Other Topics Concern  . Not on file   Social History Narrative   Originally from [place]. He has lived in [place] as well. He moved to [place] in 1973. Previously worked as a Nurse, mental health. He does enjoy wood working and uses [employer] and [employer], no exotics. No pets currently. No bird, mold, or hot tub exposure. He did have some very rare exposure to asbestos in the 1940s. 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 He was in the [employer].      Family History  Problem Relation Age of Onset  . Leukemia Mother   . CVA Father   . Asthma Father   . Cancer Brother        unsure type  . Heart disease Sister   . Anemia Sister   . Rheumatologic disease Neg Hx      Review of Systems: General: negative for chills, fever, night sweats or weight changes.  Cardiovascular:  negative for chest pain, dyspnea on exertion, edema, orthopnea, palpitations, paroxysmal nocturnal dyspnea or shortness of breath Dermatological: negative for rash Respiratory: negative for cough or wheezing Urologic: negative for hematuria Abdominal: negative for nausea, vomiting, diarrhea, bright red blood per rectum, melena, or hematemesis Neurologic: negative for visual changes, syncope, or dizziness All other systems reviewed and are otherwise negative except as noted above.    Blood pressure (!) 146/88, pulse 60, height 5\' 6"  (1.676 m), weight 163 lb 3.2 oz (74 kg).  General appearance: alert, cooperative and no distress Lungs: clear to auscultation bilaterally Heart: regular rate and rhythm Extremities: trace to 1+ bilateral LE edema Neurologic: Grossly normal   ASSESSMENT AND PLAN:   Edema leg Second office visit in two months for complaints of LE edema which is not too impressive on  exam but bothersome to the pt.  Nonischemic cardiomyopathy - possibly PVC related EF June 2017 -50-55%  Pacemaker - dual chamber Medtronic 2011 Followed by Dr Sallyanne Kuster   PLAN  I suggested he take Lasix 20 mg prn up to QOD (no more than 3x week). F/U in 3 months with Dr Sallyanne Kuster ([place] June 2018).  Kerin Ransom PA-C 04/27/2017 11:20 AM

## 2017-04-27 NOTE — Assessment & Plan Note (Signed)
Followed by Dr. Croitoru 

## 2017-04-27 NOTE — Assessment & Plan Note (Signed)
Second office visit in two months for complaints of LE edema which is not too impressive on exam but bothersome to the pt.

## 2017-04-27 NOTE — Assessment & Plan Note (Signed)
EF June 2017 -50-55%

## 2017-05-09 DIAGNOSIS — E7800 Pure hypercholesterolemia, unspecified: Secondary | ICD-10-CM | POA: Diagnosis not present

## 2017-05-09 DIAGNOSIS — I10 Essential (primary) hypertension: Secondary | ICD-10-CM | POA: Diagnosis not present

## 2017-05-09 DIAGNOSIS — Z0000 Encounter for general adult medical examination without abnormal findings: Secondary | ICD-10-CM | POA: Diagnosis not present

## 2017-05-09 DIAGNOSIS — Z23 Encounter for immunization: Secondary | ICD-10-CM | POA: Diagnosis not present

## 2017-05-15 ENCOUNTER — Other Ambulatory Visit: Payer: Self-pay | Admitting: Cardiovascular Disease

## 2017-05-15 DIAGNOSIS — K7689 Other specified diseases of liver: Secondary | ICD-10-CM | POA: Diagnosis not present

## 2017-05-15 DIAGNOSIS — R0989 Other specified symptoms and signs involving the circulatory and respiratory systems: Secondary | ICD-10-CM | POA: Diagnosis not present

## 2017-05-15 DIAGNOSIS — R0602 Shortness of breath: Secondary | ICD-10-CM | POA: Diagnosis not present

## 2017-05-15 DIAGNOSIS — M170 Bilateral primary osteoarthritis of knee: Secondary | ICD-10-CM | POA: Diagnosis not present

## 2017-05-15 DIAGNOSIS — Z950 Presence of cardiac pacemaker: Secondary | ICD-10-CM | POA: Diagnosis not present

## 2017-05-15 DIAGNOSIS — E7800 Pure hypercholesterolemia, unspecified: Secondary | ICD-10-CM | POA: Diagnosis not present

## 2017-05-15 DIAGNOSIS — I493 Ventricular premature depolarization: Secondary | ICD-10-CM | POA: Diagnosis not present

## 2017-05-15 DIAGNOSIS — R600 Localized edema: Secondary | ICD-10-CM | POA: Diagnosis not present

## 2017-05-15 DIAGNOSIS — M810 Age-related osteoporosis without current pathological fracture: Secondary | ICD-10-CM | POA: Diagnosis not present

## 2017-05-15 DIAGNOSIS — I10 Essential (primary) hypertension: Secondary | ICD-10-CM | POA: Diagnosis not present

## 2017-05-15 DIAGNOSIS — I2510 Atherosclerotic heart disease of native coronary artery without angina pectoris: Secondary | ICD-10-CM | POA: Diagnosis not present

## 2017-05-15 DIAGNOSIS — J310 Chronic rhinitis: Secondary | ICD-10-CM | POA: Diagnosis not present

## 2017-06-05 DIAGNOSIS — H01006 Unspecified blepharitis left eye, unspecified eyelid: Secondary | ICD-10-CM | POA: Diagnosis not present

## 2017-06-05 DIAGNOSIS — H1859 Other hereditary corneal dystrophies: Secondary | ICD-10-CM | POA: Diagnosis not present

## 2017-06-05 DIAGNOSIS — H02831 Dermatochalasis of right upper eyelid: Secondary | ICD-10-CM | POA: Diagnosis not present

## 2017-06-05 DIAGNOSIS — D3132 Benign neoplasm of left choroid: Secondary | ICD-10-CM | POA: Diagnosis not present

## 2017-06-05 DIAGNOSIS — H01003 Unspecified blepharitis right eye, unspecified eyelid: Secondary | ICD-10-CM | POA: Diagnosis not present

## 2017-06-05 DIAGNOSIS — H5053 Vertical heterophoria: Secondary | ICD-10-CM | POA: Diagnosis not present

## 2017-06-05 DIAGNOSIS — H40003 Preglaucoma, unspecified, bilateral: Secondary | ICD-10-CM | POA: Diagnosis not present

## 2017-06-05 DIAGNOSIS — H43822 Vitreomacular adhesion, left eye: Secondary | ICD-10-CM | POA: Diagnosis not present

## 2017-06-05 DIAGNOSIS — H35373 Puckering of macula, bilateral: Secondary | ICD-10-CM | POA: Diagnosis not present

## 2017-06-05 DIAGNOSIS — H02834 Dermatochalasis of left upper eyelid: Secondary | ICD-10-CM | POA: Diagnosis not present

## 2017-06-05 DIAGNOSIS — H02403 Unspecified ptosis of bilateral eyelids: Secondary | ICD-10-CM | POA: Diagnosis not present

## 2017-06-05 DIAGNOSIS — D3131 Benign neoplasm of right choroid: Secondary | ICD-10-CM | POA: Diagnosis not present

## 2017-06-19 ENCOUNTER — Ambulatory Visit (INDEPENDENT_AMBULATORY_CARE_PROVIDER_SITE_OTHER): Payer: Medicare Other | Admitting: *Deleted

## 2017-06-19 DIAGNOSIS — I495 Sick sinus syndrome: Secondary | ICD-10-CM | POA: Diagnosis not present

## 2017-06-21 NOTE — Progress Notes (Signed)
Remote pacemaker transmission.   

## 2017-06-22 LAB — CUP PACEART REMOTE DEVICE CHECK
Battery Impedance: 575 Ohm
Brady Statistic AP VP Percent: 1 %
Brady Statistic AP VS Percent: 97 %
Brady Statistic AS VP Percent: 0 %
Brady Statistic AS VS Percent: 2 %
Implantable Lead Implant Date: 20070421
Implantable Lead Location: 753860
Implantable Lead Model: 5092
Lead Channel Impedance Value: 429 Ohm
Lead Channel Impedance Value: 632 Ohm
Lead Channel Pacing Threshold Amplitude: 0.5 V
Lead Channel Pacing Threshold Amplitude: 0.75 V
Lead Channel Pacing Threshold Pulse Width: 0.4 ms
Lead Channel Setting Pacing Amplitude: 2 V
Lead Channel Setting Sensing Sensitivity: 4 mV
MDC IDC LEAD IMPLANT DT: 20070421
MDC IDC LEAD LOCATION: 753859
MDC IDC MSMT BATTERY REMAINING LONGEVITY: 90 mo
MDC IDC MSMT BATTERY VOLTAGE: 2.79 V
MDC IDC MSMT LEADCHNL RV PACING THRESHOLD PULSEWIDTH: 0.4 ms
MDC IDC PG IMPLANT DT: 20110816
MDC IDC SESS DTM: 20181212181227
MDC IDC SET LEADCHNL RA PACING AMPLITUDE: 1.5 V
MDC IDC SET LEADCHNL RV PACING PULSEWIDTH: 0.4 ms

## 2017-06-23 ENCOUNTER — Encounter: Payer: Self-pay | Admitting: Cardiology

## 2017-07-10 DIAGNOSIS — M1990 Unspecified osteoarthritis, unspecified site: Secondary | ICD-10-CM | POA: Insufficient documentation

## 2017-07-10 DIAGNOSIS — I493 Ventricular premature depolarization: Secondary | ICD-10-CM | POA: Insufficient documentation

## 2017-07-10 DIAGNOSIS — I499 Cardiac arrhythmia, unspecified: Secondary | ICD-10-CM | POA: Insufficient documentation

## 2017-07-24 NOTE — Progress Notes (Signed)
Cardiology Office Note   Date:  07/25/2017   ID:  Samuel Running Morales Sr., DOB 1922-04-30, MRN [id number]  PCP:  Deland Pretty, MD  Cardiologist: Dr.  Sallyanne Kuster     Chief Complaint  Patient presents with  . Edema    legs swelling  . Hypertension     History of Present Illness: Samuel Dissinger Grabski Sr. is a 84 y.o. male who presents for chronic LEE.  History of chronic kidney disease, nonischemic cardiomyopathy, DVT, coronary artery disease and arthritis.  When seen last by Kerin Ransom, PA, the patient was recommended to take Lasix 20 mg as needed, no more than 3 times a week, for the lower extremity edema.  Patient was mildly hypertensive during office visit no medications were changed at that time.  Left ventricle: The cavity size was mildly dilated. Wall   thickness was increased in a pattern of mild LVH. Systolic   function was normal. The estimated ejection fraction was in the   range of 50% to 55%. Wall motion was normal; there were no   regional wall motion abnormalities. Doppler parameters are   consistent with abnormal left ventricular relaxation (grade 1   diastolic dysfunction). - Aortic valve: There was mild regurgitation.  He comes today with continued complaints of lower extremity edema.  He was given a prescription for Lasix 20 mg as needed, 3 times a week.  He chose not to fill the prescription because he read about the Lasix online and felt he would drop his blood pressure too low.  He does not endorse heavy salt use.  He states he is keeping track of how much salt he is eating each day.  He denies dyspnea, abdominal distention, or PND.   Past Medical History:  Diagnosis Date  . Arthritis   . BPH (benign prostatic hyperplasia)   . CHF (congestive heart failure) ([place])   . CKD (chronic kidney disease)   . Coronary artery disease   . DVT (deep venous thrombosis) ([place])    H/O  . Dyslipidemia   . Hypertension   . Nonischemic cardiomyopathy ([place])   . Osteoarthritis   .  Pacemaker   . PAF (paroxysmal atrial fibrillation) ([place])   . PVC's (premature ventricular contractions)    Frequent  . SBO (small bowel obstruction) ([place]) 02/20/2014  . Ventricular arrhythmia     Past Surgical History:  Procedure Laterality Date  . A-V CARDIAC PACEMAKER INSERTION  02/23/2010   Medtronic  . ABDOMINAL SURGERY  2009  . APPENDECTOMY    . BASAL CELL CARCINOMA EXCISION     from ear  . CARDIAC CATHETERIZATION  04/21/2003   noncritical CAD  . cataract    . NM MYOCAR PERF WALL MOTION  02/12/2010   normal  . PARTIAL COLECTOMY    . TONSILLECTOMY    . US ECHOCARDIOGRAPHY  03/01/2011   pacer induced LBBB,mod. asymmetric LVH, ER 40-45%,mild AI,mild aortic root dilatation, aortic root sclerosis/ca+.  Marland Kitchen VASECTOMY       Current Outpatient Medications  Medication Sig Dispense Refill  . aspirin EC 81 MG tablet Take 81 mg by mouth daily.    . Calcium-Vitamin D (CALTRATE 600 PLUS-VIT D PO) Take 600 mg by mouth daily.     Marland Kitchen ipratropium (ATROVENT) 0.03 % nasal spray Place 2 sprays into the nose daily.    . metoprolol succinate (TOPROL-XL) 50 MG 24 hr tablet TAKE 1 TABLET BY MOUTH  DAILY 90 tablet 2  . Multiple Vitamin (MULTIVITAMIN) tablet Take 1  tablet by mouth daily.    . rosuvastatin (CRESTOR) 20 MG tablet Take 20 mg by mouth daily.     No current facility-administered medications for this visit.     Allergies:   Patient has no known allergies.    Social History:  The patient  reports that he is a non-smoker but has been exposed to tobacco smoke. he has never used smokeless tobacco. He reports that he does not drink alcohol or use drugs.   Family History:  The patient's family history includes Anemia in his sister; Asthma in his father; CVA in his father; Cancer in his brother; Heart disease in his sister; Leukemia in his mother.    ROS: All other systems are reviewed and negative. Unless otherwise mentioned in H&P    PHYSICAL EXAM: VS:  BP (!) 160/82   Pulse 98   Ht 5'  6" (1.676 m)   Wt 163 lb 3.2 oz (74 kg)   BMI 26.34 kg/m  , BMI Body mass index is 26.34 kg/m. GEN: Well nourished, well developed, in no acute distress  HEENT: normal  Neck: no JVD, carotid bruits, or masses Cardiac: RRR; no murmurs, rubs, or gallops,1+-2+ no edema  Respiratory:  clear to auscultation bilaterally, normal work of breathing GI: soft, nontender, nondistended, + BS MS: no deformity or atrophy  Skin: warm and dry, no rash Neuro:  Strength and sensation are intact Psych: euthymic mood, full affect   Recent Labs: 12/19/2016: ALT 11; BUN 23; Creatinine, Ser 1.35; Hemoglobin 13.4; Magnesium 1.6; Platelets 195; Potassium 4.8; Sodium 141    Lipid Panel No results found for: CHOL, TRIG, HDL, CHOLHDL, VLDL, LDLCALC, LDLDIRECT    Wt Readings from Last 3 Encounters:  07/25/17 163 lb 3.2 oz (74 kg)  04/27/17 163 lb 3.2 oz (74 kg)  03/20/17 162 lb 3.2 oz (73.6 kg)    ASSESSMENT AND PLAN:  1.   Chronic lower extremity edema: I have reviewed his echocardiogram he does not have bit of diastolic dysfunction, he has normal LV function.  I reviewed his most recent labs in June, there is no evidence of anemia, or kidney disease.  On review of his medications he is not on any medications that would cause fluid retention or dependent edema.  I suspect he has some venous insufficiency, and dependent edema associated with that.  I have talked with him about compression hose.  He does not want to entertain this idea as his wife wears them and finds them uncomfortable and difficult to put on.  I am going to start him on lesser strength diuretic, HCTZ, 12.5 mg as needed.  He can take it up to once or twice a week for fluid retention.  I have advised him that it will drop his pressure probably about 5-10 points but not as low as he suspected it would go on Lasix.  He will have a BMET ordered in 1 week.  2.  Hypertension: Blood pressure is elevated today.  He states at home his blood pressure  runs in the 130s and 140s.  He had seen a go as low as 120/60 and he was asymptomatic with this.  The patient is adamant about not adding a daily diuretic or addition of an antihypertensive at this time as he is afraid of becoming hypotensive.  3.  Pacemaker in situ: The patient is due to see Dr. Sallyanne Kuster, on August 04, 2017 for annual visit and pacemaker interrogation.  BMET will be available for him to  review with the patient.  Further discussion concerning his dependent edema may be also added to that office encounter, at his discretion.   Current medicines are reviewed at length with the patient today.    Labs/ tests ordered today include: BMET  Phill Myron. [place] Pugh, ANP, AACC   07/25/2017 12:05 PM    [facility] [address] [place], [place] [address] Phone: [phone]; Fax: [phone]

## 2017-07-25 ENCOUNTER — Ambulatory Visit (INDEPENDENT_AMBULATORY_CARE_PROVIDER_SITE_OTHER): Payer: Medicare Other | Admitting: Adult Health

## 2017-07-25 ENCOUNTER — Encounter: Payer: Self-pay | Admitting: Adult Health

## 2017-07-25 VITALS — BP 160/82 | HR 98 | Ht 66.0 in | Wt 163.2 lb

## 2017-07-25 DIAGNOSIS — Z79899 Other long term (current) drug therapy: Secondary | ICD-10-CM | POA: Diagnosis not present

## 2017-07-25 DIAGNOSIS — R600 Localized edema: Secondary | ICD-10-CM

## 2017-07-25 DIAGNOSIS — I10 Essential (primary) hypertension: Secondary | ICD-10-CM | POA: Diagnosis not present

## 2017-07-25 DIAGNOSIS — R609 Edema, unspecified: Secondary | ICD-10-CM | POA: Diagnosis not present

## 2017-07-25 MED ORDER — HYDROCHLOROTHIAZIDE 12.5 MG PO CAPS: 12.5000 mg | ORAL_CAPSULE | Freq: Every day | ORAL | 3 refills | Status: DC

## 2017-07-25 NOTE — Patient Instructions (Signed)
Medication Instructions:  START HYDROCHLOROTHIAZIDE 12.5MG  DAILY   If you need a refill on your cardiac medications before your next appointment, please call your pharmacy.  Labwork: BMET IN 1 WEEK (~1 WEEK) HERE IN OUR OFFICE AT [facility]  Take the provided lab slips for you to take with you to the lab for you blood draw.    You will NOT need to fast   You may go to any [facility] lab that is convenient for you however, we do have a lab in our office that is able to assist you. You do NOT need an appointment for our lab. Once in our office lobby there is a podium to the right of the check-in desk where you are to sign-in and ring a doorbell to alert [place] you are here. Lab is open Monday-Friday from 8:00am to 4:00pm; and is closed for lunch from 12:45p-1:45pm   Follow-Up: Your physician wants you to follow-up in: [place].   Thank you for choosing [facility] at [facility]!!

## 2017-08-01 DIAGNOSIS — Z79899 Other long term (current) drug therapy: Secondary | ICD-10-CM | POA: Diagnosis not present

## 2017-08-01 LAB — BASIC METABOLIC PANEL
BUN / CREAT RATIO: 21 (ref 10–24)
BUN: 26 mg/dL (ref 10–36)
CHLORIDE: 103 mmol/L (ref 96–106)
CO2: 27 mmol/L (ref 20–29)
Calcium: 9.1 mg/dL (ref 8.6–10.2)
Creatinine, Ser: 1.26 mg/dL (ref 0.76–1.27)
GFR, EST AFRICAN AMERICAN: 56 mL/min/{1.73_m2} — AB (ref >59)
GFR, EST NON AFRICAN AMERICAN: 49 mL/min/{1.73_m2} — AB (ref >59)
Glucose: 133 mg/dL — ABNORMAL HIGH (ref 65–99)
Potassium: 4.5 mmol/L (ref 3.5–5.2)
Sodium: 144 mmol/L (ref 134–144)

## 2017-08-04 ENCOUNTER — Encounter: Payer: Self-pay | Admitting: Cardiovascular Disease

## 2017-08-04 ENCOUNTER — Ambulatory Visit (INDEPENDENT_AMBULATORY_CARE_PROVIDER_SITE_OTHER): Payer: Medicare Other | Admitting: Cardiovascular Disease

## 2017-08-04 VITALS — BP 144/80 | HR 84 | Ht 66.0 in | Wt 162.0 lb

## 2017-08-04 DIAGNOSIS — I472 Ventricular tachycardia: Secondary | ICD-10-CM | POA: Diagnosis not present

## 2017-08-04 DIAGNOSIS — R600 Localized edema: Secondary | ICD-10-CM | POA: Diagnosis not present

## 2017-08-04 DIAGNOSIS — Z950 Presence of cardiac pacemaker: Secondary | ICD-10-CM

## 2017-08-04 DIAGNOSIS — I495 Sick sinus syndrome: Secondary | ICD-10-CM | POA: Diagnosis not present

## 2017-08-04 DIAGNOSIS — I4729 Other ventricular tachycardia: Secondary | ICD-10-CM

## 2017-08-04 DIAGNOSIS — I480 Paroxysmal atrial fibrillation: Secondary | ICD-10-CM

## 2017-08-04 DIAGNOSIS — I428 Other cardiomyopathies: Secondary | ICD-10-CM | POA: Diagnosis not present

## 2017-08-04 NOTE — Progress Notes (Signed)
Patient ID: Samuel AMIRAULT Sr., male   DOB: 13-May-1922, 84 y.o.   MRN: [id number]    Cardiology Office Note    Date:  08/04/2017   ID:  Kathrynn Running Walmsley Sr., DOB [birth year], MRN [id number]  PCP:  Deland Pretty, MD  Cardiologist:   Sanda Klein, MD   Chief complaint: Pacemaker check   History of Present Illness:  Samuel Edmondson Cress Sr. is a 84 y.o. male with a history of nonischemic cardiomyopathy (presumed to be PVC related cardiomyopathy), sinus node dysfunction s/p dual-chamber permanent pacemaker, remote history of paroxysmal atrial fibrillation and hyperlipidemia.  Most recent left ventricular ejection fraction was 50-55% by echo in June 2017.  He was seen a couple of times last fall with complaints of lower extremity edema but did not have dyspnea.  He was prescribed intermittent furosemide.  He is doing pretty well.  Continues to drive and lives independently with his wife  He has a remote history of lower extremity DVT. His last episode of atrial fibrillation was detected in August 2015 during an acute illness (small bowel obstruction). Anticoagulation was stopped roughly 4 years ago due to his advanced age and frailty with concern for falls.   Pacemaker interrogation today shows normal findings. He has roughly 7.5 years of estimated generator longevity, there is 97.4% atrial pacing and 1% ventricular pacing. As always he has frequent PVCs and occasional episodes of nonsustained VT, all brief. He is unaware of the ectopy. No atrial fibrillation since 2015, but a handful of episodes of PAT. Heart rate histogram distribution is as always rather blunted, but he is quite sedentary.  The patient specifically denies any chest pain at rest exertion, dyspnea at rest or with exertion, orthopnea, paroxysmal nocturnal dyspnea, syncope, palpitations, focal neurological deficits, intermittent claudication, lower extremity edema, unexplained weight gain, cough, hemoptysis or wheezing.    Past Medical  History:  Diagnosis Date  . Arthritis   . BPH (benign prostatic hyperplasia)   . CHF (congestive heart failure) ([place])   . CKD (chronic kidney disease)   . Coronary artery disease   . DVT (deep venous thrombosis) ([place])    H/O  . Dyslipidemia   . Hypertension   . Nonischemic cardiomyopathy ([place])   . Osteoarthritis   . Pacemaker   . PAF (paroxysmal atrial fibrillation) ([place])   . PVC's (premature ventricular contractions)    Frequent  . SBO (small bowel obstruction) ([place]) 02/20/2014  . Ventricular arrhythmia     Past Surgical History:  Procedure Laterality Date  . A-V CARDIAC PACEMAKER INSERTION  02/23/2010   Medtronic  . ABDOMINAL SURGERY  2009  . APPENDECTOMY    . BASAL CELL CARCINOMA EXCISION     from ear  . CARDIAC CATHETERIZATION  04/21/2003   noncritical CAD  . cataract    . NM MYOCAR PERF WALL MOTION  02/12/2010   normal  . PARTIAL COLECTOMY    . TONSILLECTOMY    . US ECHOCARDIOGRAPHY  03/01/2011   pacer induced LBBB,mod. asymmetric LVH, ER 40-45%,mild AI,mild aortic root dilatation, aortic root sclerosis/ca+.  Marland Kitchen VASECTOMY      Current Medications: Outpatient Medications Prior to Visit  Medication Sig Dispense Refill  . aspirin EC 81 MG tablet Take 81 mg by mouth daily.    . Calcium-Vitamin D (CALTRATE 600 PLUS-VIT D PO) Take 600 mg by mouth daily.     . hydrochlorothiazide (MICROZIDE) 12.5 MG capsule Take 1 capsule (12.5 mg total) by mouth daily. 90 capsule 3  .  ipratropium (ATROVENT) 0.03 % nasal spray Place 2 sprays into the nose daily.    . metoprolol succinate (TOPROL-XL) 50 MG 24 hr tablet TAKE 1 TABLET BY MOUTH  DAILY 90 tablet 2  . Multiple Vitamin (MULTIVITAMIN) tablet Take 1 tablet by mouth daily.    . rosuvastatin (CRESTOR) 20 MG tablet Take 20 mg by mouth daily.     No facility-administered medications prior to visit.      Allergies:   Patient has no known allergies.   Social History   Socioeconomic History  . Marital status: Married    Spouse  name: None  . Number of children: 2  . Years of education: None  . Highest education level: None  Social Needs  . Financial resource strain: None  . Food insecurity - worry: None  . Food insecurity - inability: None  . Transportation needs - medical: None  . Transportation needs - non-medical: None  Occupational History  . Occupation: retired  Tobacco Use  . Smoking status: Passive Smoke Exposure - Never Smoker  . Smokeless tobacco: Never Used  . Tobacco comment: At Work  Substance and Sexual Activity  . Alcohol use: No  . Drug use: No  . Sexual activity: None  Other Topics Concern  . None  Social History Narrative   Originally from [place]. He has lived in [place] as well. He moved to [place] in 1973. Previously worked as a Nurse, mental health. He does enjoy wood working and uses walnut and [employer], no exotics. No pets currently. No bird, mold, or hot tub exposure. He did have some very rare exposure to asbestos in the 1940s. 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 He was in the [employer].      Family History:  The patient's family history includes Anemia in his sister; Asthma in his father; CVA in his father; Cancer in his brother; Heart disease in his sister; Leukemia in his mother.   ROS:   Please see the history of present illness.    ROS All other systems reviewed and are negative.   PHYSICAL EXAM:   VS:  BP (!) 144/80   Pulse 84   Ht 5\' 6"  (1.676 m)   Wt 162 lb (73.5 kg)   SpO2 95%   BMI 26.15 kg/m      General: Alert, oriented x3, no distress, appears old and frail, but smiling and steady on his feet. Head: no evidence of trauma, PERRL, EOMI, no exophtalmos or lid lag, no myxedema, no xanthelasma; normal ears, nose and oropharynx Neck: normal jugular venous pulsations and no hepatojugular reflux; brisk carotid pulses without delay and no carotid bruits Chest: clear to auscultation, no signs of consolidation by percussion or palpation, normal fremitus, symmetrical  and full respiratory excursions Cardiovascular: normal position and quality of the apical impulse, regular rhythm, normal first and second heart sounds, no murmurs, rubs or gallops Abdomen: no tenderness or distention, no masses by palpation, no abnormal pulsatility or arterial bruits, normal bowel sounds, no hepatosplenomegaly Extremities: no clubbing, cyanosis; noedema; 2+ radial, ulnar and brachial pulses bilaterally; 2+ right femoral, posterior tibial and dorsalis pedis pulses; 2+ left femoral, posterior tibial and dorsalis pedis pulses; no subclavian or femoral bnoruits Neurological: grossly nonfocal Psych: Normal mood and affect   Wt Readings from Last 3 Encounters:  08/04/17 162 lb (73.5 kg)  07/25/17 163 lb 3.2 oz (74 kg)  04/27/17 163 lb 3.2 oz (74 kg)      Studies/Labs Reviewed:  EKG:  EKG is not ordered today.    ASSESSMENT:    1. SSS (sick sinus syndrome) ([place])   2. Edema of both legs   3. Nonischemic cardiomyopathy ([place])   4. NSVT (nonsustained ventricular tachycardia) ([facility])   5. Paroxysmal atrial fibrillation ([place])   6. Pacemaker      PLAN:  In order of problems listed above:  1. SSS: Heart rate histogram is appropriate for his relatively sedentary lifestyle.  2. Edema: Has resolved with low-dose hydrochlorothiazide.  No other signs of congestive heart failure 3. CMP: Low normal left ventricular systolic function. Suspected of having PVC cardiomyopathy, improved over the years. He is on beta blocker therapy. 4. VT: As before he has occasional nonsustained VT but the episodes are shorter and fewer than in the past and they remain asymptomatic. NoI don't think further evaluation or treatment would be indicated in this gentleman. 5. AFib: No atrial fibrillation has been detected in almost 4 years.  Not on anticoagulation due to perceived risk of falls and injury 6. Normal pacemaker function. Remote downloads every 3 months and office visit yearly.    Medication  Adjustments/Labs and Tests Ordered: Current medicines are reviewed at length with the patient today.  Concerns regarding medicines are outlined above.  Medication changes, Labs and Tests ordered today are listed in the Patient Instructions below. Patient Instructions  Dr Sallyanne Kuster recommends that you continue on your current medications as directed. Please refer to the Current Medication list given to you today.  Remote monitoring is used to monitor your Pacemaker or ICD from home. This monitoring reduces the number of office visits required to check your device to one time per year. It allows [place] to keep an eye on the functioning of your device to ensure it is working properly. You are scheduled for a device check from home on Monday, March 11th, 2019. You may send your transmission at any time that day. If you have a wireless device, the transmission will be sent automatically. After your physician reviews your transmission, you will receive a notification with your next transmission date.  Dr Sallyanne Kuster recommends that you schedule a follow-up appointment in 12 months with a pacemaker check. You will receive a reminder letter in the mail two months in advance. If you don't receive a letter, please call our office to schedule the follow-up appointment.  If you need a refill on your cardiac medications before your next appointment, please call your pharmacy.    Signed, Sanda Klein, MD  08/04/2017 9:07 AM    [facility] [place], [place], [place]  [address] Phone: [phone]; Fax: [phone]

## 2017-08-04 NOTE — Patient Instructions (Signed)

## 2017-08-30 LAB — CUP PACEART INCLINIC DEVICE CHECK
Brady Statistic AP VP Percent: 1 %
Brady Statistic AS VS Percent: 2 %
Date Time Interrogation Session: 20190125132839
Implantable Lead Implant Date: 20070421
Implantable Lead Location: 753859
Implantable Pulse Generator Implant Date: 20110816
Lead Channel Impedance Value: 440 Ohm
Lead Channel Impedance Value: 632 Ohm
Lead Channel Pacing Threshold Amplitude: 0.625 V
Lead Channel Pacing Threshold Pulse Width: 0.4 ms
Lead Channel Setting Sensing Sensitivity: 4 mV
MDC IDC LEAD IMPLANT DT: 20070421
MDC IDC LEAD LOCATION: 753860
MDC IDC MSMT BATTERY IMPEDANCE: 574 Ohm
MDC IDC MSMT BATTERY REMAINING LONGEVITY: 90 mo
MDC IDC MSMT BATTERY VOLTAGE: 2.78 V
MDC IDC MSMT LEADCHNL RA PACING THRESHOLD AMPLITUDE: 0.625 V
MDC IDC MSMT LEADCHNL RA PACING THRESHOLD PULSEWIDTH: 0.4 ms
MDC IDC SET LEADCHNL RA PACING AMPLITUDE: 1.5 V
MDC IDC SET LEADCHNL RV PACING AMPLITUDE: 2 V
MDC IDC SET LEADCHNL RV PACING PULSEWIDTH: 0.4 ms
MDC IDC STAT BRADY AP VS PERCENT: 96 %
MDC IDC STAT BRADY AS VP PERCENT: 0 %

## 2017-09-18 ENCOUNTER — Ambulatory Visit (INDEPENDENT_AMBULATORY_CARE_PROVIDER_SITE_OTHER): Payer: Medicare Other | Admitting: *Deleted

## 2017-09-18 DIAGNOSIS — I495 Sick sinus syndrome: Secondary | ICD-10-CM

## 2017-09-18 NOTE — Progress Notes (Signed)
Remote pacemaker transmission.   

## 2017-09-20 ENCOUNTER — Encounter: Payer: Self-pay | Admitting: Cardiology

## 2017-09-21 LAB — CUP PACEART REMOTE DEVICE CHECK
Battery Remaining Longevity: 87 mo
Brady Statistic AP VS Percent: 96 %
Brady Statistic AS VP Percent: 0 %
Brady Statistic AS VS Percent: 3 %
Date Time Interrogation Session: 20190311173011
Implantable Lead Implant Date: 20070421
Implantable Lead Implant Date: 20070421
Implantable Lead Location: 753859
Implantable Lead Model: 5092
Implantable Lead Model: 5594
Lead Channel Impedance Value: 649 Ohm
Lead Channel Pacing Threshold Amplitude: 0.625 V
Lead Channel Pacing Threshold Amplitude: 0.625 V
Lead Channel Pacing Threshold Pulse Width: 0.4 ms
Lead Channel Setting Pacing Amplitude: 1.5 V
MDC IDC LEAD LOCATION: 753860
MDC IDC MSMT BATTERY IMPEDANCE: 625 Ohm
MDC IDC MSMT BATTERY VOLTAGE: 2.79 V
MDC IDC MSMT LEADCHNL RA IMPEDANCE VALUE: 455 Ohm
MDC IDC MSMT LEADCHNL RA PACING THRESHOLD PULSEWIDTH: 0.4 ms
MDC IDC PG IMPLANT DT: 20110816
MDC IDC SET LEADCHNL RV PACING AMPLITUDE: 2 V
MDC IDC SET LEADCHNL RV PACING PULSEWIDTH: 0.4 ms
MDC IDC SET LEADCHNL RV SENSING SENSITIVITY: 4 mV
MDC IDC STAT BRADY AP VP PERCENT: 1 %

## 2017-10-26 ENCOUNTER — Other Ambulatory Visit: Payer: Self-pay

## 2017-10-26 MED ORDER — HYDROCHLOROTHIAZIDE 12.5 MG PO CAPS: 12.5000 mg | ORAL_CAPSULE | Freq: Every day | ORAL | 3 refills | Status: AC

## 2017-12-13 DIAGNOSIS — D3132 Benign neoplasm of left choroid: Secondary | ICD-10-CM | POA: Diagnosis not present

## 2017-12-13 DIAGNOSIS — Z961 Presence of intraocular lens: Secondary | ICD-10-CM | POA: Diagnosis not present

## 2017-12-13 DIAGNOSIS — H04123 Dry eye syndrome of bilateral lacrimal glands: Secondary | ICD-10-CM | POA: Diagnosis not present

## 2017-12-13 DIAGNOSIS — H527 Unspecified disorder of refraction: Secondary | ICD-10-CM | POA: Diagnosis not present

## 2017-12-13 DIAGNOSIS — H26492 Other secondary cataract, left eye: Secondary | ICD-10-CM | POA: Diagnosis not present

## 2017-12-18 ENCOUNTER — Ambulatory Visit (INDEPENDENT_AMBULATORY_CARE_PROVIDER_SITE_OTHER): Payer: Medicare Other | Admitting: *Deleted

## 2017-12-18 DIAGNOSIS — I495 Sick sinus syndrome: Secondary | ICD-10-CM

## 2017-12-18 NOTE — Progress Notes (Signed)
Remote pacemaker transmission.   

## 2017-12-19 ENCOUNTER — Encounter: Payer: Self-pay | Admitting: Cardiology

## 2017-12-19 LAB — CUP PACEART REMOTE DEVICE CHECK
Battery Impedance: 700 Ohm
Battery Remaining Longevity: 82 mo
Battery Voltage: 2.78 V
Brady Statistic AP VP Percent: 1 %
Brady Statistic AP VS Percent: 96 %
Brady Statistic AS VP Percent: 0 %
Date Time Interrogation Session: 20190610134048
Implantable Lead Implant Date: 20070421
Implantable Lead Location: 753860
Implantable Lead Model: 5092
Implantable Lead Model: 5594
Implantable Pulse Generator Implant Date: 20110816
Lead Channel Impedance Value: 447 Ohm
Lead Channel Impedance Value: 644 Ohm
Lead Channel Pacing Threshold Amplitude: 0.625 V
Lead Channel Pacing Threshold Pulse Width: 0.4 ms
Lead Channel Setting Pacing Amplitude: 2 V
Lead Channel Setting Pacing Pulse Width: 0.4 ms
MDC IDC LEAD IMPLANT DT: 20070421
MDC IDC LEAD LOCATION: 753859
MDC IDC MSMT LEADCHNL RV PACING THRESHOLD AMPLITUDE: 0.625 V
MDC IDC MSMT LEADCHNL RV PACING THRESHOLD PULSEWIDTH: 0.4 ms
MDC IDC MSMT LEADCHNL RV SENSING INTR AMPL: 11.2 mV
MDC IDC SET LEADCHNL RA PACING AMPLITUDE: 1.5 V
MDC IDC SET LEADCHNL RV SENSING SENSITIVITY: 4 mV
MDC IDC STAT BRADY AS VS PERCENT: 3 %

## 2017-12-25 DIAGNOSIS — M50322 Other cervical disc degeneration at C5-C6 level: Secondary | ICD-10-CM | POA: Diagnosis not present

## 2017-12-25 DIAGNOSIS — M1611 Unilateral primary osteoarthritis, right hip: Secondary | ICD-10-CM | POA: Diagnosis not present

## 2017-12-25 DIAGNOSIS — M5136 Other intervertebral disc degeneration, lumbar region: Secondary | ICD-10-CM | POA: Diagnosis not present

## 2017-12-25 DIAGNOSIS — M8588 Other specified disorders of bone density and structure, other site: Secondary | ICD-10-CM | POA: Diagnosis not present

## 2017-12-25 DIAGNOSIS — M47812 Spondylosis without myelopathy or radiculopathy, cervical region: Secondary | ICD-10-CM | POA: Diagnosis not present

## 2018-01-30 DIAGNOSIS — J9811 Atelectasis: Secondary | ICD-10-CM | POA: Diagnosis not present

## 2018-01-30 DIAGNOSIS — R0602 Shortness of breath: Secondary | ICD-10-CM | POA: Diagnosis not present

## 2018-01-30 DIAGNOSIS — R0989 Other specified symptoms and signs involving the circulatory and respiratory systems: Secondary | ICD-10-CM | POA: Diagnosis not present

## 2018-02-02 DIAGNOSIS — I10 Essential (primary) hypertension: Secondary | ICD-10-CM | POA: Insufficient documentation

## 2018-02-02 DIAGNOSIS — I509 Heart failure, unspecified: Secondary | ICD-10-CM | POA: Insufficient documentation

## 2018-02-02 DIAGNOSIS — M1990 Unspecified osteoarthritis, unspecified site: Secondary | ICD-10-CM | POA: Insufficient documentation

## 2018-02-02 DIAGNOSIS — I82409 Acute embolism and thrombosis of unspecified deep veins of unspecified lower extremity: Secondary | ICD-10-CM | POA: Insufficient documentation

## 2018-02-02 DIAGNOSIS — I2510 Atherosclerotic heart disease of native coronary artery without angina pectoris: Secondary | ICD-10-CM | POA: Insufficient documentation

## 2018-02-02 DIAGNOSIS — N189 Chronic kidney disease, unspecified: Secondary | ICD-10-CM | POA: Insufficient documentation

## 2018-02-02 DIAGNOSIS — I480 Paroxysmal atrial fibrillation: Secondary | ICD-10-CM | POA: Insufficient documentation

## 2018-02-02 DIAGNOSIS — E785 Hyperlipidemia, unspecified: Secondary | ICD-10-CM | POA: Insufficient documentation

## 2018-02-02 DIAGNOSIS — N400 Enlarged prostate without lower urinary tract symptoms: Secondary | ICD-10-CM | POA: Insufficient documentation

## 2018-02-07 DIAGNOSIS — Z950 Presence of cardiac pacemaker: Secondary | ICD-10-CM | POA: Diagnosis not present

## 2018-02-07 DIAGNOSIS — Z45018 Encounter for adjustment and management of other part of cardiac pacemaker: Secondary | ICD-10-CM | POA: Diagnosis not present

## 2018-02-07 DIAGNOSIS — I480 Paroxysmal atrial fibrillation: Secondary | ICD-10-CM | POA: Diagnosis not present

## 2018-02-07 DIAGNOSIS — I495 Sick sinus syndrome: Secondary | ICD-10-CM | POA: Diagnosis not present

## 2018-03-19 ENCOUNTER — Encounter: Payer: Medicare Other | Admitting: *Deleted

## 2018-03-19 ENCOUNTER — Telehealth: Payer: Self-pay

## 2018-03-19 NOTE — Telephone Encounter (Signed)
Attempted to confirm remote transmission with pt. No answer and was unable to leave a message.  Phone number no longer in service.

## 2018-03-20 ENCOUNTER — Encounter: Payer: Self-pay | Admitting: Cardiology

## 2018-03-30 ENCOUNTER — Telehealth: Payer: Self-pay

## 2018-03-30 NOTE — Telephone Encounter (Signed)
Pt called stating he is being followed by a Dr. In [facility]

## 2018-03-30 NOTE — Telephone Encounter (Signed)
LMOVM for pt to return call 

## 2018-04-16 DIAGNOSIS — N189 Chronic kidney disease, unspecified: Secondary | ICD-10-CM | POA: Diagnosis not present

## 2018-04-16 DIAGNOSIS — I10 Essential (primary) hypertension: Secondary | ICD-10-CM | POA: Diagnosis not present

## 2018-04-16 DIAGNOSIS — H6121 Impacted cerumen, right ear: Secondary | ICD-10-CM | POA: Diagnosis not present

## 2018-04-16 DIAGNOSIS — I2510 Atherosclerotic heart disease of native coronary artery without angina pectoris: Secondary | ICD-10-CM | POA: Diagnosis not present

## 2018-04-16 DIAGNOSIS — H9193 Unspecified hearing loss, bilateral: Secondary | ICD-10-CM | POA: Diagnosis not present

## 2018-04-16 DIAGNOSIS — I480 Paroxysmal atrial fibrillation: Secondary | ICD-10-CM | POA: Diagnosis not present

## 2018-04-16 DIAGNOSIS — I509 Heart failure, unspecified: Secondary | ICD-10-CM | POA: Diagnosis not present

## 2018-04-19 ENCOUNTER — Ambulatory Visit (INDEPENDENT_AMBULATORY_CARE_PROVIDER_SITE_OTHER): Payer: Medicare Other | Admitting: *Deleted

## 2018-04-19 DIAGNOSIS — I495 Sick sinus syndrome: Secondary | ICD-10-CM | POA: Diagnosis not present

## 2018-04-19 NOTE — Progress Notes (Signed)
Remote pacemaker transmission.   

## 2018-04-26 ENCOUNTER — Encounter: Payer: Self-pay | Admitting: Cardiology

## 2018-04-30 DIAGNOSIS — H905 Unspecified sensorineural hearing loss: Secondary | ICD-10-CM | POA: Diagnosis not present

## 2018-04-30 DIAGNOSIS — H903 Sensorineural hearing loss, bilateral: Secondary | ICD-10-CM | POA: Diagnosis not present

## 2018-04-30 DIAGNOSIS — Z950 Presence of cardiac pacemaker: Secondary | ICD-10-CM | POA: Diagnosis not present

## 2018-04-30 DIAGNOSIS — H6123 Impacted cerumen, bilateral: Secondary | ICD-10-CM | POA: Diagnosis not present

## 2018-05-29 LAB — CUP PACEART REMOTE DEVICE CHECK
Battery Impedance: 880 Ohm
Battery Remaining Longevity: 73 mo
Brady Statistic AP VP Percent: 2 %
Brady Statistic AP VS Percent: 94 %
Brady Statistic AS VS Percent: 4 %
Implantable Lead Implant Date: 20070421
Implantable Lead Implant Date: 20070421
Implantable Lead Location: 753859
Implantable Lead Model: 5092
Implantable Lead Model: 5594
Lead Channel Impedance Value: 636 Ohm
Lead Channel Pacing Threshold Pulse Width: 0.4 ms
Lead Channel Setting Pacing Amplitude: 1.5 V
Lead Channel Setting Pacing Amplitude: 2 V
Lead Channel Setting Sensing Sensitivity: 4 mV
MDC IDC LEAD LOCATION: 753860
MDC IDC MSMT BATTERY VOLTAGE: 2.78 V
MDC IDC MSMT LEADCHNL RA IMPEDANCE VALUE: 435 Ohm
MDC IDC MSMT LEADCHNL RA PACING THRESHOLD AMPLITUDE: 0.625 V
MDC IDC MSMT LEADCHNL RV PACING THRESHOLD AMPLITUDE: 0.625 V
MDC IDC MSMT LEADCHNL RV PACING THRESHOLD PULSEWIDTH: 0.4 ms
MDC IDC PG IMPLANT DT: 20110816
MDC IDC SESS DTM: 20191010133547
MDC IDC SET LEADCHNL RV PACING PULSEWIDTH: 0.4 ms
MDC IDC STAT BRADY AS VP PERCENT: 0 %

## 2018-07-02 ENCOUNTER — Encounter: Payer: Self-pay | Admitting: *Deleted

## 2018-07-02 ENCOUNTER — Other Ambulatory Visit: Payer: Self-pay | Admitting: *Deleted

## 2018-07-02 NOTE — Progress Notes (Signed)
Crestor Dose changed and refilled through PCP from [facility].

## 2018-07-18 DIAGNOSIS — Z950 Presence of cardiac pacemaker: Secondary | ICD-10-CM | POA: Diagnosis not present

## 2018-07-18 DIAGNOSIS — I480 Paroxysmal atrial fibrillation: Secondary | ICD-10-CM | POA: Diagnosis not present

## 2018-07-18 DIAGNOSIS — I878 Other specified disorders of veins: Secondary | ICD-10-CM | POA: Diagnosis not present

## 2018-07-18 DIAGNOSIS — N189 Chronic kidney disease, unspecified: Secondary | ICD-10-CM | POA: Diagnosis not present

## 2018-07-18 DIAGNOSIS — I129 Hypertensive chronic kidney disease with stage 1 through stage 4 chronic kidney disease, or unspecified chronic kidney disease: Secondary | ICD-10-CM | POA: Diagnosis not present

## 2018-07-19 ENCOUNTER — Ambulatory Visit (INDEPENDENT_AMBULATORY_CARE_PROVIDER_SITE_OTHER): Payer: Medicare Other

## 2018-07-19 DIAGNOSIS — I495 Sick sinus syndrome: Secondary | ICD-10-CM

## 2018-07-20 NOTE — Progress Notes (Signed)
Remote pacemaker transmission.   

## 2018-07-21 LAB — CUP PACEART REMOTE DEVICE CHECK
Battery Remaining Longevity: 68 mo
Battery Voltage: 2.79 V
Brady Statistic AP VP Percent: 2 %
Implantable Lead Implant Date: 20070421
Implantable Pulse Generator Implant Date: 20110816
Lead Channel Impedance Value: 441 Ohm
Lead Channel Pacing Threshold Amplitude: 0.75 V
Lead Channel Pacing Threshold Pulse Width: 0.4 ms
Lead Channel Setting Pacing Amplitude: 1.5 V
Lead Channel Setting Pacing Pulse Width: 0.4 ms
Lead Channel Setting Sensing Sensitivity: 4 mV
MDC IDC LEAD IMPLANT DT: 20070421
MDC IDC LEAD LOCATION: 753859
MDC IDC LEAD LOCATION: 753860
MDC IDC MSMT BATTERY IMPEDANCE: 984 Ohm
MDC IDC MSMT LEADCHNL RA PACING THRESHOLD AMPLITUDE: 0.625 V
MDC IDC MSMT LEADCHNL RA PACING THRESHOLD PULSEWIDTH: 0.4 ms
MDC IDC MSMT LEADCHNL RV IMPEDANCE VALUE: 633 Ohm
MDC IDC SESS DTM: 20200109143659
MDC IDC SET LEADCHNL RV PACING AMPLITUDE: 2 V
MDC IDC STAT BRADY AP VS PERCENT: 95 %
MDC IDC STAT BRADY AS VP PERCENT: 0 %
MDC IDC STAT BRADY AS VS PERCENT: 3 %

## 2018-09-11 DIAGNOSIS — L820 Inflamed seborrheic keratosis: Secondary | ICD-10-CM | POA: Diagnosis not present

## 2018-09-11 DIAGNOSIS — L218 Other seborrheic dermatitis: Secondary | ICD-10-CM | POA: Diagnosis not present

## 2018-09-19 DIAGNOSIS — K117 Disturbances of salivary secretion: Secondary | ICD-10-CM | POA: Diagnosis not present

## 2018-09-19 DIAGNOSIS — M47812 Spondylosis without myelopathy or radiculopathy, cervical region: Secondary | ICD-10-CM | POA: Diagnosis not present

## 2018-09-19 DIAGNOSIS — M5136 Other intervertebral disc degeneration, lumbar region: Secondary | ICD-10-CM | POA: Diagnosis not present

## 2018-09-19 DIAGNOSIS — M40204 Unspecified kyphosis, thoracic region: Secondary | ICD-10-CM | POA: Diagnosis not present

## 2018-10-18 ENCOUNTER — Other Ambulatory Visit: Payer: Self-pay

## 2018-10-18 ENCOUNTER — Ambulatory Visit (INDEPENDENT_AMBULATORY_CARE_PROVIDER_SITE_OTHER): Payer: Medicare Other | Admitting: *Deleted

## 2018-10-18 DIAGNOSIS — I495 Sick sinus syndrome: Secondary | ICD-10-CM | POA: Diagnosis not present

## 2018-10-18 LAB — CUP PACEART REMOTE DEVICE CHECK
Battery Impedance: 1035 Ohm
Battery Remaining Longevity: 64 mo
Battery Voltage: 2.78 V
Brady Statistic AP VP Percent: 2 %
Brady Statistic AP VS Percent: 95 %
Brady Statistic AS VP Percent: 0 %
Brady Statistic AS VS Percent: 3 %
Date Time Interrogation Session: 20200409132656
Implantable Lead Implant Date: 20070421
Implantable Lead Implant Date: 20070421
Implantable Lead Location: 753859
Implantable Lead Location: 753860
Implantable Lead Model: 5092
Implantable Lead Model: 5594
Implantable Pulse Generator Implant Date: 20110816
Lead Channel Impedance Value: 431 Ohm
Lead Channel Impedance Value: 619 Ohm
Lead Channel Pacing Threshold Amplitude: 0.625 V
Lead Channel Pacing Threshold Amplitude: 0.875 V
Lead Channel Pacing Threshold Pulse Width: 0.4 ms
Lead Channel Pacing Threshold Pulse Width: 0.4 ms
Lead Channel Sensing Intrinsic Amplitude: 8 mV
Lead Channel Setting Pacing Amplitude: 1.75 V
Lead Channel Setting Pacing Amplitude: 2 V
Lead Channel Setting Pacing Pulse Width: 0.4 ms
Lead Channel Setting Sensing Sensitivity: 4 mV

## 2018-10-29 ENCOUNTER — Encounter: Payer: Self-pay | Admitting: Cardiology

## 2018-10-29 NOTE — Progress Notes (Signed)
Remote pacemaker transmission.   

## 2018-12-10 ENCOUNTER — Telehealth: Payer: Self-pay

## 2018-12-10 NOTE — Telephone Encounter (Signed)
Received voice message from wife.  She requested a call back at [phone] on behalf patient regarding device appointments.  Will forward to [facility] for follow up.

## 2018-12-10 NOTE — Telephone Encounter (Signed)
Understood - please continue our remote monitoring schedule until we know that he has been transferred over [facility]

## 2018-12-10 NOTE — Telephone Encounter (Signed)
Spoke to pt wife, recent move to [place]. Wife states they cannot get to [facility] for any future appt. Instructed wife to call pt current cardiologist to get EP referral. Pt wife states understanding. Will transfer remote monitoring once established w/ new EP.

## 2019-01-14 ENCOUNTER — Telehealth: Payer: Self-pay | Admitting: Cardiovascular Disease

## 2019-01-14 NOTE — Telephone Encounter (Signed)
I let the pt wife know we did release him in [facility] and his Dr. Gabriel Carina can transfer him into their clinic. I also called [facility] to let them know as well.

## 2019-01-14 NOTE — Telephone Encounter (Signed)
Routed to medical records and device clinic for follow up

## 2019-01-14 NOTE — Telephone Encounter (Signed)
Wife of patient wants to clarify that the patient's records have been sent to Dr. Durward Parcel at [facility] The office number is  [phone]. The patient is scheduled for a remote transmission on 7/09 and wants to make sure those results will be sent to Dr. Casper Harrison

## 2019-01-17 ENCOUNTER — Encounter: Payer: Medicare Other | Admitting: *Deleted

## 2020-12-09 DEATH — deceased
